# Patient Record
Sex: Male | Born: 1945
Health system: Southern US, Community
[De-identification: ages and names within clinical notes are randomized; demographics above are authoritative.]

## PROBLEM LIST (undated history)

## (undated) DIAGNOSIS — I10 Essential (primary) hypertension: Secondary | ICD-10-CM

## (undated) DIAGNOSIS — N183 Chronic kidney disease, stage 3 unspecified: Secondary | ICD-10-CM

## (undated) DIAGNOSIS — K219 Gastro-esophageal reflux disease without esophagitis: Secondary | ICD-10-CM

## (undated) DIAGNOSIS — D329 Benign neoplasm of meninges, unspecified: Secondary | ICD-10-CM

## (undated) DIAGNOSIS — Z974 Presence of external hearing-aid: Secondary | ICD-10-CM

## (undated) DIAGNOSIS — E785 Hyperlipidemia, unspecified: Secondary | ICD-10-CM

## (undated) DIAGNOSIS — E119 Type 2 diabetes mellitus without complications: Secondary | ICD-10-CM

## (undated) DIAGNOSIS — H9190 Unspecified hearing loss, unspecified ear: Secondary | ICD-10-CM

## (undated) DIAGNOSIS — Z9889 Other specified postprocedural states: Secondary | ICD-10-CM

## (undated) HISTORY — PX: BRAIN SURGERY: SHX531

## (undated) HISTORY — DX: Essential (primary) hypertension: I10

## (undated) HISTORY — DX: Type 2 diabetes mellitus without complications: E11.9

## (undated) HISTORY — DX: Hyperlipidemia, unspecified: E78.5

## (undated) HISTORY — PX: TONSILLECTOMY: SUR1361

## (undated) HISTORY — PX: APPENDECTOMY: SHX54

---

## 1997-05-22 ENCOUNTER — Emergency Department (HOSPITAL_COMMUNITY): Admission: EM | Admit: 1997-05-22 | Discharge: 1997-05-22 | Payer: Self-pay | Admitting: Emergency Medicine

## 1998-07-12 ENCOUNTER — Encounter: Payer: Self-pay | Admitting: Internal Medicine

## 1998-07-12 ENCOUNTER — Ambulatory Visit (HOSPITAL_COMMUNITY): Admission: RE | Admit: 1998-07-12 | Discharge: 1998-07-12 | Payer: Self-pay | Admitting: Internal Medicine

## 1998-07-15 ENCOUNTER — Encounter: Payer: Self-pay | Admitting: Neurological Surgery

## 1998-07-16 ENCOUNTER — Inpatient Hospital Stay (HOSPITAL_COMMUNITY): Admission: RE | Admit: 1998-07-16 | Discharge: 1998-07-19 | Payer: Self-pay | Admitting: Neurological Surgery

## 1999-06-10 ENCOUNTER — Encounter: Admission: RE | Admit: 1999-06-10 | Discharge: 1999-06-10 | Payer: Self-pay | Admitting: Neurological Surgery

## 1999-06-10 ENCOUNTER — Encounter: Payer: Self-pay | Admitting: Neurological Surgery

## 2000-06-08 ENCOUNTER — Encounter: Admission: RE | Admit: 2000-06-08 | Discharge: 2000-06-08 | Payer: Self-pay | Admitting: Neurological Surgery

## 2000-06-08 ENCOUNTER — Encounter: Payer: Self-pay | Admitting: Neurological Surgery

## 2003-05-17 ENCOUNTER — Encounter: Admission: RE | Admit: 2003-05-17 | Discharge: 2003-05-17 | Payer: Self-pay | Admitting: Neurological Surgery

## 2010-05-04 ENCOUNTER — Other Ambulatory Visit: Payer: Self-pay | Admitting: General Surgery

## 2010-05-04 ENCOUNTER — Inpatient Hospital Stay (HOSPITAL_COMMUNITY)
Admission: EM | Admit: 2010-05-04 | Discharge: 2010-05-07 | DRG: 340 | Disposition: A | Discharge status: Home / Self Care | Payer: Medicare Other | Attending: Surgery | Admitting: Surgery

## 2010-05-04 ENCOUNTER — Emergency Department (HOSPITAL_COMMUNITY): Payer: Medicare Other

## 2010-05-04 DIAGNOSIS — E785 Hyperlipidemia, unspecified: Secondary | ICD-10-CM | POA: Diagnosis present

## 2010-05-04 DIAGNOSIS — E7800 Pure hypercholesterolemia, unspecified: Secondary | ICD-10-CM | POA: Diagnosis present

## 2010-05-04 DIAGNOSIS — K35209 Acute appendicitis with generalized peritonitis, without abscess, unspecified as to perforation: Principal | ICD-10-CM | POA: Diagnosis present

## 2010-05-04 DIAGNOSIS — I10 Essential (primary) hypertension: Secondary | ICD-10-CM | POA: Diagnosis present

## 2010-05-04 DIAGNOSIS — K352 Acute appendicitis with generalized peritonitis, without abscess: Principal | ICD-10-CM | POA: Diagnosis present

## 2010-05-04 LAB — BASIC METABOLIC PANEL
BUN: 14 mg/dL (ref 6–23)
CO2: 26 mEq/L (ref 19–32)
Calcium: 8.6 mg/dL (ref 8.4–10.5)
Chloride: 106 mEq/L (ref 96–112)
Creatinine, Ser: 1.22 mg/dL (ref 0.4–1.5)
GFR calc Af Amer: >60 mL/min (ref >60)
GFR calc non Af Amer: 59 mL/min — ABNORMAL LOW (ref >60)
Glucose, Bld: 119 mg/dL — ABNORMAL HIGH (ref 70–99)
Potassium: 4.1 mEq/L (ref 3.5–5.1)
Sodium: 137 mEq/L (ref 135–145)

## 2010-05-04 MED ORDER — IOHEXOL 300 MG/ML  SOLN: 100.0000 mL | Freq: Once | INTRAMUSCULAR | Status: AC | PRN

## 2010-05-05 LAB — CBC
HCT: 39.9 % (ref 39.0–52.0)
MCHC: 34.6 g/dL (ref 30.0–36.0)
Platelets: 165 10*3/uL (ref 150–400)
RDW: 12.1 % (ref 11.5–15.5)
WBC: 12.4 10*3/uL — ABNORMAL HIGH (ref 4.0–10.5)

## 2010-05-06 LAB — CBC
Hemoglobin: 11.9 g/dL — ABNORMAL LOW (ref 13.0–17.0)
MCH: 32 pg (ref 26.0–34.0)
MCHC: 34.8 g/dL (ref 30.0–36.0)
Platelets: 134 10*3/uL — ABNORMAL LOW (ref 150–400)
RBC: 3.72 MIL/uL — ABNORMAL LOW (ref 4.22–5.81)

## 2010-05-06 LAB — COMPREHENSIVE METABOLIC PANEL
ALT: 14 U/L (ref 0–53)
AST: 21 U/L (ref 0–37)
Albumin: 2.6 g/dL — ABNORMAL LOW (ref 3.5–5.2)
Calcium: 7.8 mg/dL — ABNORMAL LOW (ref 8.4–10.5)
Chloride: 101 mEq/L (ref 96–112)
Creatinine, Ser: 1.17 mg/dL (ref 0.4–1.5)
GFR calc Af Amer: >60 mL/min (ref >60)
Sodium: 132 mEq/L — ABNORMAL LOW (ref 135–145)

## 2010-05-09 NOTE — Op Note (Signed)
Shaw, Luis             ACCOUNT NO.:  [id number]  MEDICAL RECORD NO.:  [id number]           PATIENT TYPE:  O  LOCATION:  5149                         FACILITY:  [facility]  PHYSICIAN:  Gabrielle Dare. Janee Morn, M.D.DATE OF BIRTH:  02/23/1945  DATE OF PROCEDURE:  05/04/2010 DATE OF DISCHARGE:                              OPERATIVE REPORT   PREOPERATIVE DIAGNOSIS:  Acute appendicitis  POSTOPERATIVE DIAGNOSIS:  Acute appendicitis with localized perforation.  PROCEDURE:  Laparoscopic appendectomy.  SURGEON:  Gabrielle Dare. Janee Morn, MD  ANESTHESIA:  General endotracheal.  HISTORY OF PRESENT ILLNESS:  Luis Shaw is a 65 year old gentleman who was evaluated in the emergency department for abdominal pain.  History, physical exam and CT scan of the abdomen and pelvis are consistent with acute appendicitis.  He is brought emergently to the operating room for appendectomy.  PROCEDURE IN DETAIL:  Informed consent was obtained.  The patient was identified in the preop holding area.  He received intravenous antibiotics.  He was brought to the operating room.  General endotracheal anesthesia was administered by the Anesthesia staff.  His abdomen was prepped and draped in a sterile fashion.  A Foley catheter had been placed by the nursing staff prior to prepping.  We did a time- out procedure.  The infraumbilical area was infiltrated with 0.25% Marcaine with epinephrine and infraumbilical incision was made. Subcutaneous tissues were dissected down revealing the anterior fascia. This was divided sharply along the midline and the peritoneal cavity was entered under direct vision without difficulty.  A 0 Vicryl pursestring suture was placed around the fascial opening.  Hasson trocar was inserted into the abdomen.  Next under direct vision, a 5-mm right midabdomen port and a 12-mm left lower quadrant port were placed.  A 0.25% Marcaine with epinephrine was used at these port sites as  well. Laparoscopic exploration revealed an acutely inflamed appendix with localized perforation along the mid portion.  There was only a small amount of localized lesion.  The appendix was retracted up  towards the anterior abdominal wall.  A portion of the mesoappendix was accessible. This was divided with the harmonic scalpel.  Next, the base of the appendix was dissected free.  It was not inflamed.  It was then divided with endoscopic GIA stapler with vascular load.  The staple line was intact.  The remainder of the mesoappendix was carefully divided with the harmonic scalpel and the appendix was placed in an EndoCatch bag and removed from the abdomen via the left lower quadrant port site.  The abdomen was then copiously irrigated with several liters of saline.  The right lower quadrant area was irrigated to a completely clean.  There was no bleeding from the mesoappendix.  The staple line remained completely intact.  The irrigation fluid was evacuated and it was clear. The pneumoperitoneum was released.  The ports were removed under direct vision.  The infraumbilical fascia was closed by tying a 0 Vicryl pursestring suture with care not to trap the intra-abdominal contents. All three wounds were copiously irrigated and the skin of each was then closed with running 4-0 Vicryl subcuticular stitch followed by Dermabond.  Sponge, needle and instrument counts were all correct.  The patient tolerated the procedure well without apparent complication, was taken to the recovery room in stable condition.     Gabrielle Dare Janee Morn, M.D.     BET/MEDQ  D:  05/04/2010  T:  05/04/2010  Job:  [id number]  cc:   Luis Shaw, M.D.  Electronically Signed by Violeta Gelinas M.D. on 05/09/2010 10:57:21 AM

## 2010-05-09 NOTE — H&P (Signed)
  Luis Shaw, Luis Shaw             ACCOUNT NO.:  [id number]  MEDICAL RECORD NO.:  [id number]           PATIENT TYPE:  O  LOCATION:  5149                         FACILITY:  [facility]  PHYSICIAN:  Gabrielle Dare. Janee Morn, M.D.DATE OF BIRTH:  1945/05/08  DATE OF ADMISSION:  05/04/2010 DATE OF DISCHARGE:                             HISTORY & PHYSICAL   CHIEF COMPLAINT:  Right lower quadrant abdominal pain.  HISTORY OF PRESENT ILLNESS:  Luis Shaw is a 65 year old white male who complains of acute onset of right lower quadrant abdominal pain at 12:30 this morning, awoken from sleep he continued to have pain with associated nausea.  He had no vomiting.  The patient went to an Urgent Care and was referred to the [facility] Emergency Department where further evaluation was done including CT scan of the abdomen and pelvis showing acute appendicitis.  PAST MEDICAL HISTORY:  Hypertension and hypercholesterolemia.  PAST SURGICAL HISTORY:  Brain tumor removed by Dr. Danielle Dess in 2000.  SOCIAL HISTORY:  Does not smoke, does not drink alcohol.  He is currently retired.  MEDICATIONS:  Lipitor and a blood pressure medication that he does not recall the name of.  REVIEW OF SYSTEMS:  GI: As above, otherwise negative.  PHYSICAL EXAM:  VITAL SIGNS:  Temperature 98.5, pulse 71, respirations 19, blood pressure 165/75. GENERAL:  He is awake and alert.  He is in no distress. HEENT:  He wears glasses.  Pupils are equal and reactive.  Oral mucosa is moist. NECK:  Supple with no tenderness or masses. LUNGS:  Clear to auscultation with good respiratory effort.  No wheezing is heard. HEART:  Regular, S1-S2 and pulses palpable in the left chest. ABDOMEN:  Tender in the right lower quadrant with no guarding present. Bowel sounds are hypoactive.  No masses are felt.  No organomegaly is noted. EXTREMITIES:  Have no deformity or tenderness. SKIN:  Warm and dry. NEUROLOGIC:  Grossly intact.  Speech is fluent.   Moves all extremities well.  LABORATORY STUDIES:  Sodium 137, potassium 4.1, chloride 106, CO2 26, BUN 14, creatinine 1.22, glucose 119.  CT scan of the abdomen and pelvis demonstrates acute appendicitis without evidence of perforation.  IMPRESSION:  Acute appendicitis.  PLAN:  We will take him to the operating room for laparoscopic appendectomy.  We will give him IV antibiotics.  Procedure risks and benefits were discussed in detail with the patient and his wife and he is agreeable.     Gabrielle Dare Janee Morn, M.D.     BET/MEDQ  D:  05/04/2010  T:  05/04/2010  Job:  [id number]  cc:   Hal T. Stoneking, M.D.  Electronically Signed by Violeta Gelinas M.D. on 05/09/2010 10:57:18 AM

## 2010-05-22 NOTE — Discharge Summary (Signed)
  Luis Shaw, Luis Shaw             ACCOUNT NO.:  [id number]  MEDICAL RECORD NO.:  [id number]           PATIENT TYPE:  I  LOCATION:  5149                         FACILITY:  [facility]  PHYSICIAN:  Thornton Park. Daphine Deutscher, MD  DATE OF BIRTH:  [birth year]  DATE OF ADMISSION:  05/04/2010 DATE OF DISCHARGE:  05/07/2010                        DISCHARGE SUMMARY - REFERRING   ADMISSION DIAGNOSES: 1. Right lower quadrant abdominal pain with acute appendicitis. 2. History of brain tumor removed in 2000 by Dr. Danielle Dess. 3. Hypertension. 4. Dyslipidemia.  DISCHARGE DIAGNOSES: 1. Right lower quadrant abdominal pain with acute appendicitis. 2. History of brain tumor removed in 2000 by Dr. Danielle Dess. 3. Hypertension. 4. Dyslipidemia.  PROCEDURES:  Laparoscopic appendectomy with findings of localized perforation.  BRIEF HISTORY:  The patient is a 65 year old white male who developed right lower quadrant abdominal pain, which woke him from sleep.  He presented to the ER at [facility].  He underwent a CT scan which was consistent with acute appendicitis without evidence of perforation.  He was taken to the operating room by Dr. Janee Morn and was found actually to have a localized perforation of his appendix.  The patient tolerated the procedure well and returned to the floor in satisfactory condition.  His white count improved, but he had a localized ileus.  This resolved early this morning, May 07, 2010.  He actually had eggs for breakfast.  He has no further nausea.  He is passing flatus without difficulty.  He would like a Dulcolax suppository to have a bowel movement.  Once that is done, we hope to discharge him home.  We will discharge him home on his preadmission medicines.  ADMISSION MEDICINES: 1. Lisinopril 10 mg daily. 2. Multivitamin 1 daily. 3. Simvastatin 20 mg daily.  ADDITIONAL DISCHARGE MEDICINES: 1. Augmentin 875/125 one p.o. b.i.d. for 5 days. 2. Ibuprofen 200 mg 2-3 tablets  q.4 h. p.r.n. for pain and     oxycodone/APAP 5/325 1-2 q.4 h. p.r.n. for pain.  FOLLOWUP:  In 2 weeks in the [facility].  LABORATORY DATA:  Electrolytes are normal.  BUN is 10, creatinine is 1.17.  White count is 11,000 yesterday with hemoglobin 11.9, hematocrit 34, platelets 134,000.  CONDITION ON DISCHARGE:  Improved.     Eber Hong, P.A.   ______________________________ Thornton Park Daphine Deutscher, MD    WDJ/MEDQ  D:  05/07/2010  T:  05/07/2010  Job:  [id number]  cc:   Hal T. Stoneking, M.D.  Electronically Signed by Sherrie George P.A. on 05/12/2010 11:52:50 AM Electronically Signed by Luretha Murphy MD on 05/22/2010 08:37:05 AM

## 2010-06-23 ENCOUNTER — Encounter (INDEPENDENT_AMBULATORY_CARE_PROVIDER_SITE_OTHER): Payer: Self-pay | Admitting: General Surgery

## 2011-03-25 ENCOUNTER — Other Ambulatory Visit: Payer: Self-pay | Admitting: Internal Medicine

## 2011-03-25 ENCOUNTER — Ambulatory Visit
Admission: RE | Admit: 2011-03-25 | Discharge: 2011-03-25 | Disposition: A | Discharge status: Home / Self Care | Payer: Medicare Other | Source: Ambulatory Visit | Attending: Internal Medicine | Admitting: Internal Medicine

## 2011-03-25 DIAGNOSIS — R1031 Right lower quadrant pain: Secondary | ICD-10-CM

## 2011-03-25 MED ORDER — IOHEXOL 300 MG/ML  SOLN
100.0000 mL | Freq: Once | INTRAMUSCULAR | Status: AC | PRN
  Administered 2011-03-25: 100 mL via INTRAVENOUS

## 2012-04-21 IMAGING — CT CT ABD-PELV W/ CM
1 of 5 series · 14 of 36 positions shown, 19 images · IV contrast (30CC OMNI 300 & [ID] OMNI 300)
Comparison: CT of the abdomen pelvis of 05/04/2010

CLINICAL DATA: Right lower quadrant pain for 4 days, history of
prior appendectomy

CT ABDOMEN AND PELVIS WITH CONTRAST
TECHNIQUE: Multidetector CT imaging of the abdomen and pelvis was
performed following the standard protocol during bolus
administration of intravenous contrast.
Contrast: 100mL OMNIPAQUE IOHEXOL 300 MG/ML IJ SOLN

[Series 2: abd/pelvis with · axial · 0.70mm/px · z∈[-411,+4]mm · 14 of 93 slices shown, 19 images]
[im 5/93  soft-tissue]
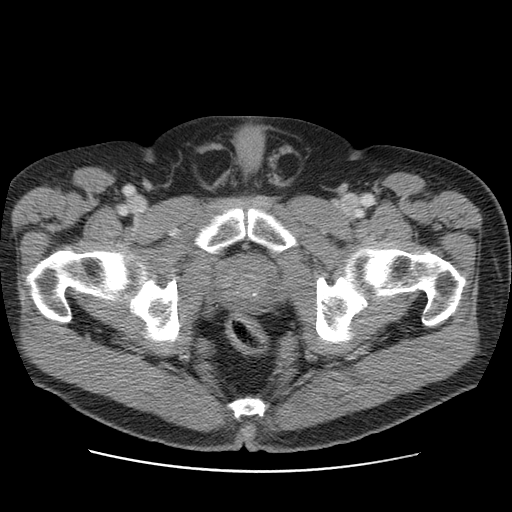
[im 5/93  bone]
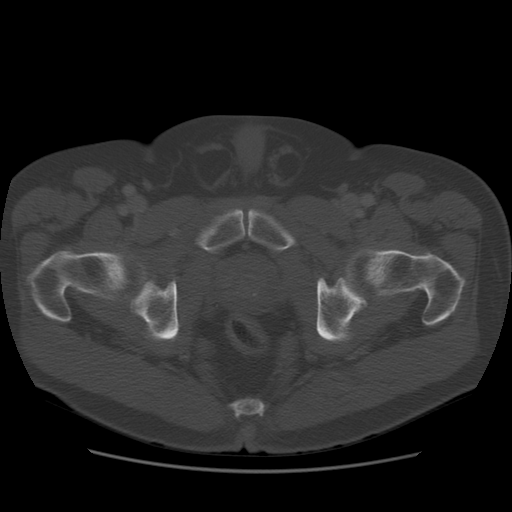
[im 15/93  soft-tissue]
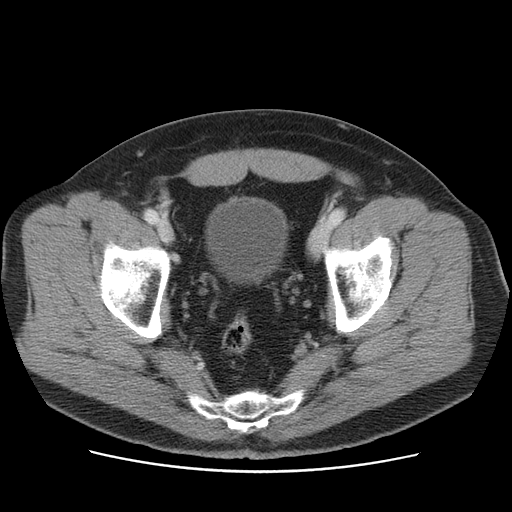
[im 20/93  soft-tissue]
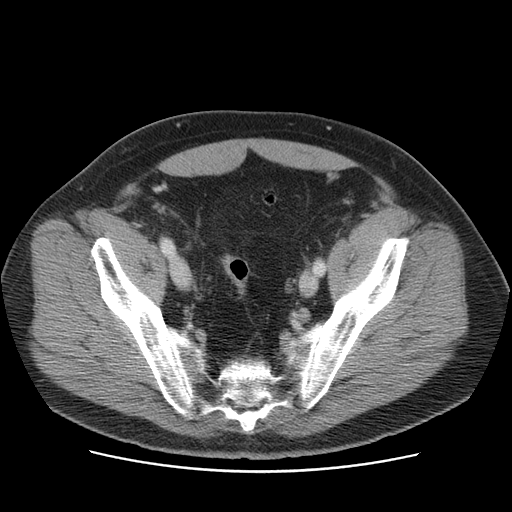
[im 25/93  soft-tissue]
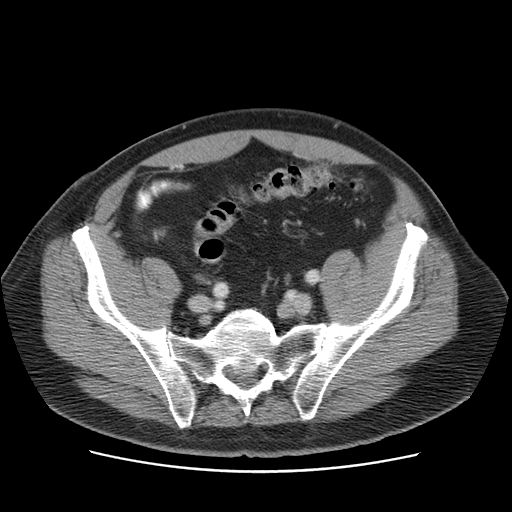
[im 34/93  soft-tissue]
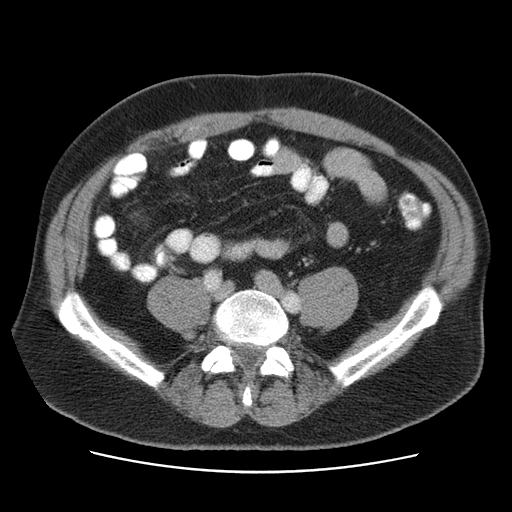
[im 39/93  soft-tissue]
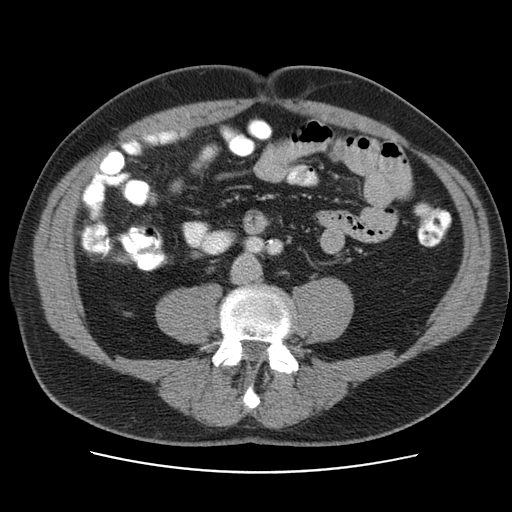
[im 49/93  soft-tissue]
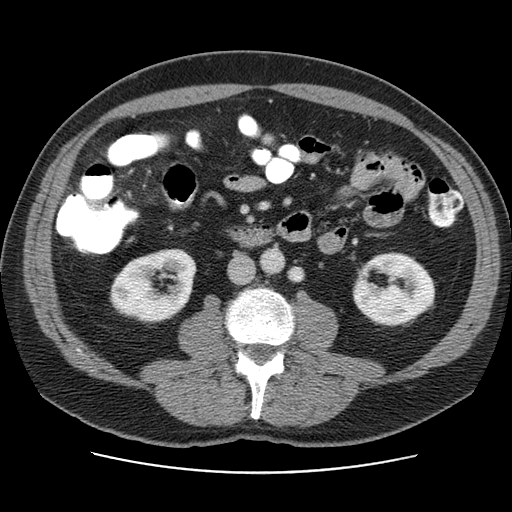
[im 54/93  soft-tissue]
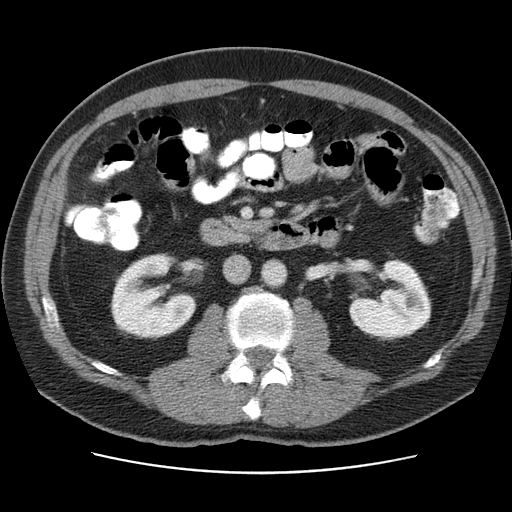
[im 59/93  soft-tissue]
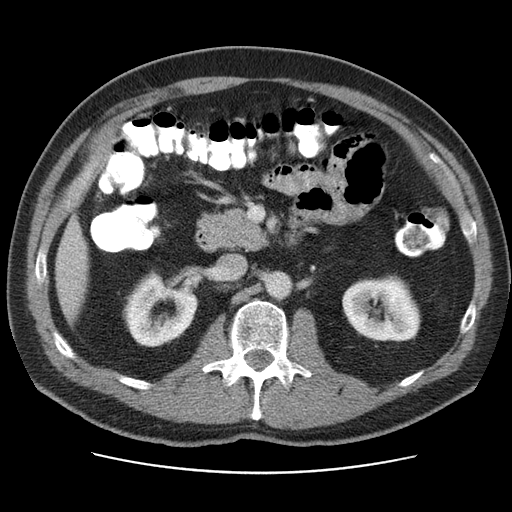
[im 59/93  bone]
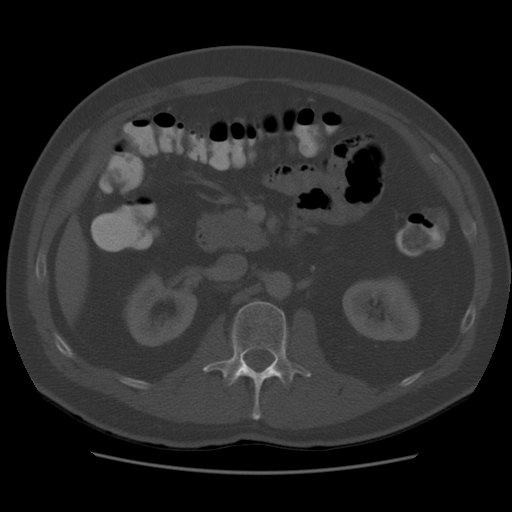
[im 68/93  soft-tissue]
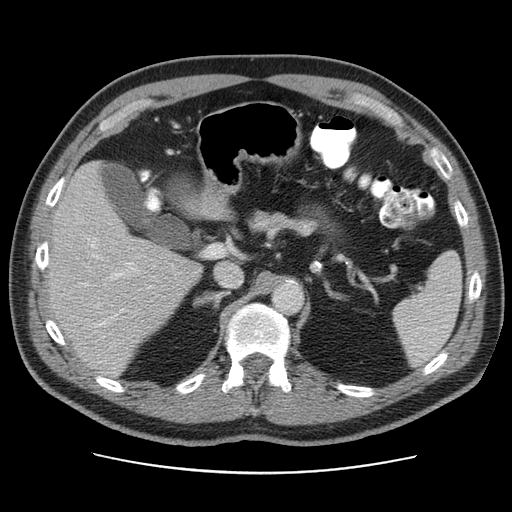
[im 73/93  soft-tissue]
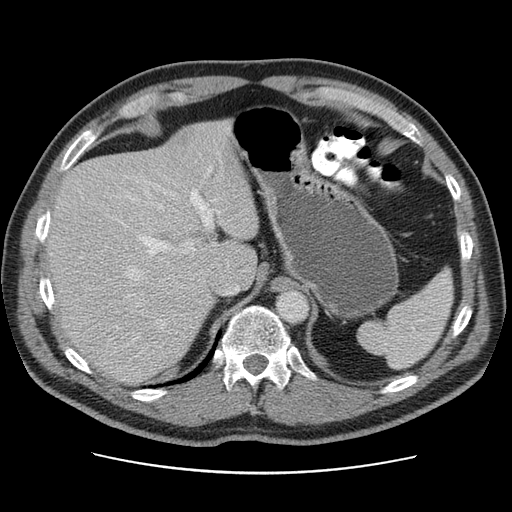
[im 73/93  lung]
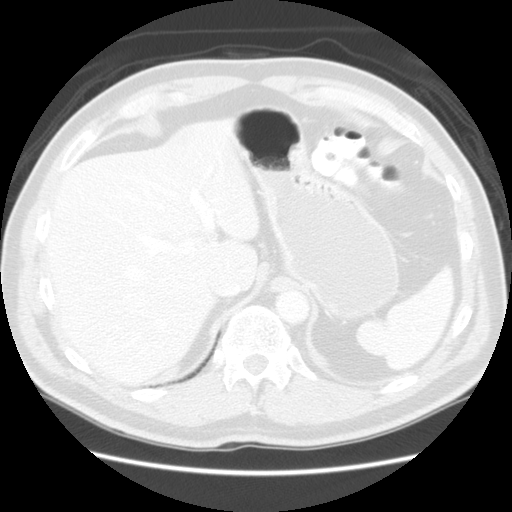
[im 78/93  soft-tissue]
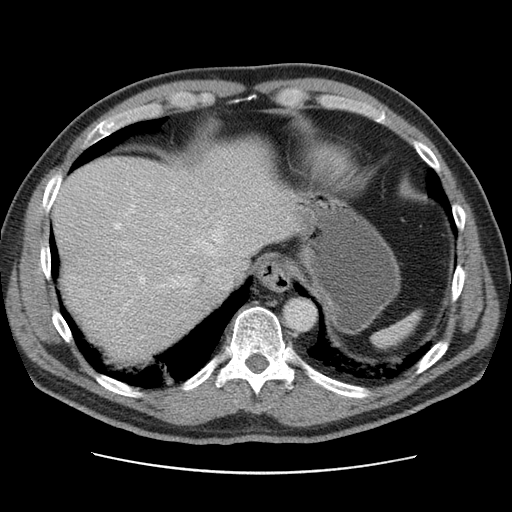
[im 78/93  lung]
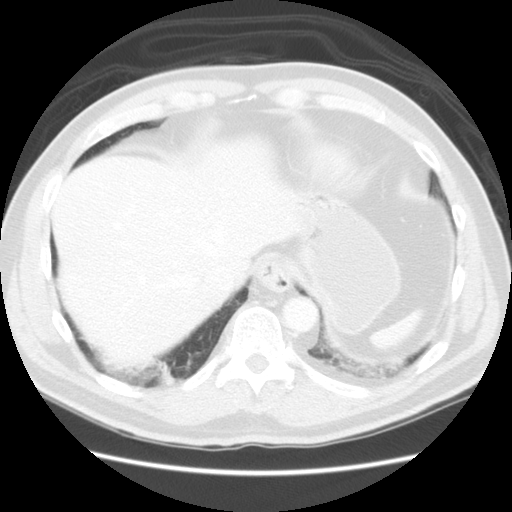
[im 83/93  lung]
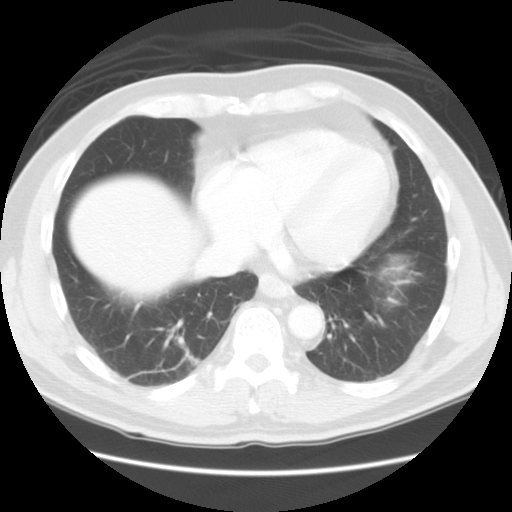
[im 88/93  soft-tissue]
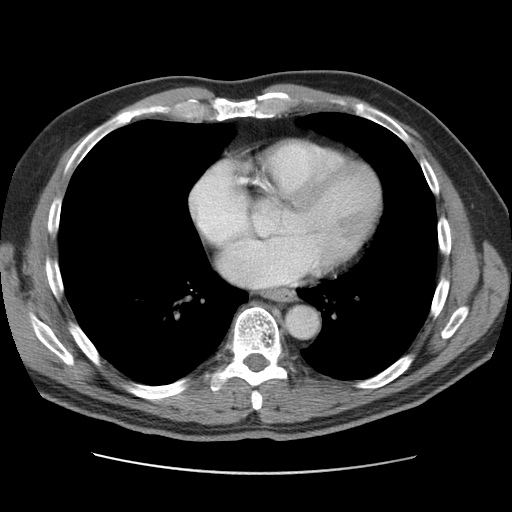
[im 88/93  lung]
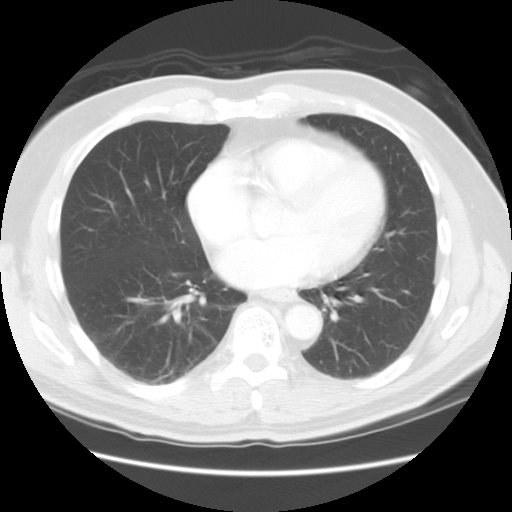

[14 of 36 positions shown; findings below may reference images not displayed]

FINDINGS: Linear scarring at the lung bases is stable.  The liver
enhances with no focal abnormality and no ductal dilatation is
seen.  Coronary artery calcifications are noted.  No calcified
gallstones are seen.  The pancreas is normal in size and the
pancreatic duct is not dilated.  The adrenal glands and spleen are
unremarkable.  The stomach is moderately fluid distended and there
may be a small hiatal hernia present.  No gastric abnormality is
evident.  The kidneys enhance and small renal cysts appear stable.
The abdominal aorta is normal in caliber with only mild
atheromatous change present.  No adenopathy is seen.

The urinary bladder is not well distended but is unremarkable.  The
distal ureters are normal in caliber.  The prostate is within
normal limits in size for age.  There are scattered rectosigmoid
colonic diverticula present but no diverticulitis is seen.  No
abnormality of the colon is seen.  The terminal ileum is
unremarkable.  The appendix has been resected.  Fat enters both
inguinal canals.  A sclerotic focus in the right pelvic ramus is
unremarkable.  No acute bony abnormality is seen.
IMPRESSION: 1.  No explanation for the patient's pain is seen.  Prior
appendectomy with no complicating features.
2.  The terminal ileum appears normal.
3.  Coronary artery calcifications.
4.  Scattered rectosigmoid colonic diverticula.

## 2013-11-14 ENCOUNTER — Encounter: Payer: Medicare HMO | Attending: Geriatric Medicine

## 2013-11-14 VITALS — Ht 69.0 in | Wt 187.1 lb

## 2013-11-14 DIAGNOSIS — Z713 Dietary counseling and surveillance: Secondary | ICD-10-CM | POA: Diagnosis not present

## 2013-11-14 DIAGNOSIS — E119 Type 2 diabetes mellitus without complications: Secondary | ICD-10-CM | POA: Insufficient documentation

## 2013-11-14 NOTE — Progress Notes (Signed)

## 2013-11-21 ENCOUNTER — Encounter: Payer: Commercial Managed Care - HMO | Attending: Geriatric Medicine

## 2013-11-21 DIAGNOSIS — E119 Type 2 diabetes mellitus without complications: Secondary | ICD-10-CM | POA: Insufficient documentation

## 2013-11-21 DIAGNOSIS — Z713 Dietary counseling and surveillance: Secondary | ICD-10-CM | POA: Diagnosis not present

## 2013-11-21 NOTE — Progress Notes (Signed)

## 2013-11-28 DIAGNOSIS — E119 Type 2 diabetes mellitus without complications: Secondary | ICD-10-CM

## 2013-11-30 NOTE — Progress Notes (Signed)
Patient was seen on 11/29/13 for the third of a series of three diabetes self-management courses at the [facility]. The following learning objectives were met by the patient during this class:  . State the amount of activity recommended for healthy living . Describe activities suitable for individual needs . Identify ways to regularly incorporate activity into daily life . Identify barriers to activity and ways to over come these barriers  Identify diabetes medications being personally used and their primary action for lowering glucose and possible side effects . Describe role of stress on blood glucose and develop strategies to address psychosocial issues . Identify diabetes complications and ways to prevent them  Explain how to manage diabetes during illness . Evaluate success in meeting personal goal . Establish 2-3 goals that they will plan to diligently work on until they return for the  64-monthfollow-up visit  Goals:   I will count my carb choices at most meals and snacks  I will be active 30 minutes or more 5 times a week  I will take my diabetes medications as scheduled  I will test my glucose at least 1 times a day, 7 days a week  Your patient has identified these potential barriers to change:  None noted  Your patient has identified their diabetes self-care support plan as  Family Support  Plan:  Attend Core 4 in 4 months

## 2013-12-05 ENCOUNTER — Ambulatory Visit: Payer: Medicare Other

## 2013-12-12 ENCOUNTER — Ambulatory Visit: Payer: Medicare Other

## 2013-12-19 ENCOUNTER — Ambulatory Visit: Payer: Medicare Other

## 2015-02-19 DIAGNOSIS — Z23 Encounter for immunization: Secondary | ICD-10-CM | POA: Diagnosis not present

## 2015-02-19 DIAGNOSIS — Z7984 Long term (current) use of oral hypoglycemic drugs: Secondary | ICD-10-CM | POA: Diagnosis not present

## 2015-02-19 DIAGNOSIS — I10 Essential (primary) hypertension: Secondary | ICD-10-CM | POA: Diagnosis not present

## 2015-02-19 DIAGNOSIS — G4733 Obstructive sleep apnea (adult) (pediatric): Secondary | ICD-10-CM | POA: Diagnosis not present

## 2015-02-19 DIAGNOSIS — E119 Type 2 diabetes mellitus without complications: Secondary | ICD-10-CM | POA: Diagnosis not present

## 2015-02-19 DIAGNOSIS — D369 Benign neoplasm, unspecified site: Secondary | ICD-10-CM | POA: Diagnosis not present

## 2015-02-19 DIAGNOSIS — M1A0790 Idiopathic chronic gout, unspecified ankle and foot, without tophus (tophi): Secondary | ICD-10-CM | POA: Diagnosis not present

## 2015-02-19 DIAGNOSIS — Z1389 Encounter for screening for other disorder: Secondary | ICD-10-CM | POA: Diagnosis not present

## 2015-02-19 DIAGNOSIS — Z79899 Other long term (current) drug therapy: Secondary | ICD-10-CM | POA: Diagnosis not present

## 2015-02-19 DIAGNOSIS — Z0000 Encounter for general adult medical examination without abnormal findings: Secondary | ICD-10-CM | POA: Diagnosis not present

## 2015-03-21 ENCOUNTER — Other Ambulatory Visit: Payer: Self-pay | Admitting: Gastroenterology

## 2015-04-30 ENCOUNTER — Ambulatory Visit (HOSPITAL_COMMUNITY): Admission: RE | Admit: 2015-04-30 | Payer: PPO | Source: Ambulatory Visit | Admitting: Gastroenterology

## 2015-04-30 ENCOUNTER — Encounter (HOSPITAL_COMMUNITY): Admission: RE | Payer: Self-pay | Source: Ambulatory Visit

## 2015-04-30 SURGERY — COLONOSCOPY WITH PROPOFOL
Anesthesia: Monitor Anesthesia Care

## 2015-08-15 DIAGNOSIS — Z7984 Long term (current) use of oral hypoglycemic drugs: Secondary | ICD-10-CM | POA: Diagnosis not present

## 2015-08-15 DIAGNOSIS — I10 Essential (primary) hypertension: Secondary | ICD-10-CM | POA: Diagnosis not present

## 2015-08-15 DIAGNOSIS — E119 Type 2 diabetes mellitus without complications: Secondary | ICD-10-CM | POA: Diagnosis not present

## 2015-12-16 DIAGNOSIS — G4733 Obstructive sleep apnea (adult) (pediatric): Secondary | ICD-10-CM | POA: Diagnosis not present

## 2016-02-25 DIAGNOSIS — Z7984 Long term (current) use of oral hypoglycemic drugs: Secondary | ICD-10-CM | POA: Diagnosis not present

## 2016-02-25 DIAGNOSIS — E119 Type 2 diabetes mellitus without complications: Secondary | ICD-10-CM | POA: Diagnosis not present

## 2016-02-25 DIAGNOSIS — E1165 Type 2 diabetes mellitus with hyperglycemia: Secondary | ICD-10-CM | POA: Diagnosis not present

## 2016-02-25 DIAGNOSIS — K635 Polyp of colon: Secondary | ICD-10-CM | POA: Diagnosis not present

## 2016-02-25 DIAGNOSIS — R809 Proteinuria, unspecified: Secondary | ICD-10-CM | POA: Diagnosis not present

## 2016-02-25 DIAGNOSIS — I10 Essential (primary) hypertension: Secondary | ICD-10-CM | POA: Diagnosis not present

## 2016-02-25 DIAGNOSIS — Z79899 Other long term (current) drug therapy: Secondary | ICD-10-CM | POA: Diagnosis not present

## 2016-02-25 DIAGNOSIS — Z1389 Encounter for screening for other disorder: Secondary | ICD-10-CM | POA: Diagnosis not present

## 2016-02-25 DIAGNOSIS — G4733 Obstructive sleep apnea (adult) (pediatric): Secondary | ICD-10-CM | POA: Diagnosis not present

## 2016-02-25 DIAGNOSIS — Z0000 Encounter for general adult medical examination without abnormal findings: Secondary | ICD-10-CM | POA: Diagnosis not present

## 2016-02-25 DIAGNOSIS — E1121 Type 2 diabetes mellitus with diabetic nephropathy: Secondary | ICD-10-CM | POA: Diagnosis not present

## 2016-02-25 DIAGNOSIS — E7800 Pure hypercholesterolemia, unspecified: Secondary | ICD-10-CM | POA: Diagnosis not present

## 2016-03-19 DIAGNOSIS — H35033 Hypertensive retinopathy, bilateral: Secondary | ICD-10-CM | POA: Diagnosis not present

## 2016-03-19 DIAGNOSIS — H524 Presbyopia: Secondary | ICD-10-CM | POA: Diagnosis not present

## 2016-03-19 DIAGNOSIS — E119 Type 2 diabetes mellitus without complications: Secondary | ICD-10-CM | POA: Diagnosis not present

## 2016-03-27 ENCOUNTER — Other Ambulatory Visit: Payer: Self-pay | Admitting: Gastroenterology

## 2016-04-21 DIAGNOSIS — E1165 Type 2 diabetes mellitus with hyperglycemia: Secondary | ICD-10-CM | POA: Diagnosis not present

## 2016-04-21 DIAGNOSIS — Z7984 Long term (current) use of oral hypoglycemic drugs: Secondary | ICD-10-CM | POA: Diagnosis not present

## 2016-04-21 DIAGNOSIS — Z79899 Other long term (current) drug therapy: Secondary | ICD-10-CM | POA: Diagnosis not present

## 2016-05-25 NOTE — Anesthesia Preprocedure Evaluation (Addendum)
Anesthesia Evaluation  Patient identified by MRN, date of birth, ID band Patient awake    Reviewed: Allergy & Precautions, Patient's Chart, lab work & pertinent test results  History of Anesthesia Complications Negative for: history of anesthetic complications  Airway Mallampati: II  TM Distance: >3 FB Neck ROM: Full    Dental no notable dental hx. (+) Dental Advisory Given   Pulmonary neg pulmonary ROS,    Pulmonary exam normal        Cardiovascular hypertension, Normal cardiovascular exam     Neuro/Psych negative neurological ROS  negative psych ROS   GI/Hepatic negative GI ROS, Neg liver ROS,   Endo/Other  diabetes  Renal/GU negative Renal ROS     Musculoskeletal negative musculoskeletal ROS (+)   Abdominal   Peds  Hematology negative hematology ROS (+)   Anesthesia Other Findings Day of surgery medications reviewed with the patient.  Reproductive/Obstetrics                            Anesthesia Physical Anesthesia Plan  ASA: II  Anesthesia Plan: MAC   Post-op Pain Management:    Induction:   Airway Management Planned: Natural Airway and Simple Face Mask  Additional Equipment:   Intra-op Plan:   Post-operative Plan:   Informed Consent: I have reviewed the patients History and Physical, chart, labs and discussed the procedure including the risks, benefits and alternatives for the proposed anesthesia with the patient or authorized representative who has indicated his/her understanding and acceptance.   Dental advisory given  Plan Discussed with: Anesthesiologist and CRNA  Anesthesia Plan Comments:        Anesthesia Quick Evaluation

## 2016-05-26 ENCOUNTER — Ambulatory Visit (HOSPITAL_COMMUNITY): Payer: PPO | Admitting: Anesthesiology

## 2016-05-26 ENCOUNTER — Encounter (HOSPITAL_COMMUNITY): Payer: Self-pay | Admitting: *Deleted

## 2016-05-26 ENCOUNTER — Ambulatory Visit (HOSPITAL_COMMUNITY)
Admission: RE | Admit: 2016-05-26 | Discharge: 2016-05-26 | Disposition: A | Discharge status: Home / Self Care | Payer: PPO | Source: Ambulatory Visit | Attending: Gastroenterology | Admitting: Gastroenterology

## 2016-05-26 ENCOUNTER — Encounter (HOSPITAL_COMMUNITY)
Admission: RE | Disposition: A | Discharge status: Home / Self Care | Payer: Self-pay | Source: Ambulatory Visit | Attending: Gastroenterology

## 2016-05-26 DIAGNOSIS — E119 Type 2 diabetes mellitus without complications: Secondary | ICD-10-CM | POA: Insufficient documentation

## 2016-05-26 DIAGNOSIS — Z1211 Encounter for screening for malignant neoplasm of colon: Secondary | ICD-10-CM | POA: Diagnosis not present

## 2016-05-26 DIAGNOSIS — I10 Essential (primary) hypertension: Secondary | ICD-10-CM | POA: Diagnosis not present

## 2016-05-26 DIAGNOSIS — E7800 Pure hypercholesterolemia, unspecified: Secondary | ICD-10-CM | POA: Diagnosis not present

## 2016-05-26 DIAGNOSIS — G4733 Obstructive sleep apnea (adult) (pediatric): Secondary | ICD-10-CM | POA: Insufficient documentation

## 2016-05-26 DIAGNOSIS — Z86010 Personal history of colonic polyps: Secondary | ICD-10-CM | POA: Insufficient documentation

## 2016-05-26 HISTORY — PX: COLONOSCOPY WITH PROPOFOL: SHX5780

## 2016-05-26 LAB — GLUCOSE, CAPILLARY: GLUCOSE-CAPILLARY: 147 mg/dL — AB (ref 65–99)

## 2016-05-26 SURGERY — COLONOSCOPY WITH PROPOFOL
Anesthesia: Monitor Anesthesia Care

## 2016-05-26 MED ORDER — LIDOCAINE 2% (20 MG/ML) 5 ML SYRINGE
INTRAMUSCULAR | Status: DC | PRN
  Administered 2016-05-26: 80 mg via INTRAVENOUS

## 2016-05-26 MED ORDER — ONDANSETRON HCL 4 MG/2ML IJ SOLN
INTRAMUSCULAR | Status: AC
  Filled 2016-05-26: qty 2

## 2016-05-26 MED ORDER — ONDANSETRON HCL 4 MG/2ML IJ SOLN
INTRAMUSCULAR | Status: DC | PRN
  Administered 2016-05-26: 4 mg via INTRAVENOUS

## 2016-05-26 MED ORDER — PROPOFOL 10 MG/ML IV BOLUS
INTRAVENOUS | Status: AC
  Filled 2016-05-26: qty 40

## 2016-05-26 MED ORDER — SODIUM CHLORIDE 0.9 % IV SOLN: INTRAVENOUS | Status: DC

## 2016-05-26 MED ORDER — PROPOFOL 500 MG/50ML IV EMUL
INTRAVENOUS | Status: DC | PRN
  Administered 2016-05-26: 75 ug/kg/min via INTRAVENOUS

## 2016-05-26 MED ORDER — LIDOCAINE 2% (20 MG/ML) 5 ML SYRINGE
INTRAMUSCULAR | Status: AC
  Filled 2016-05-26: qty 5

## 2016-05-26 MED ORDER — PROPOFOL 10 MG/ML IV BOLUS
INTRAVENOUS | Status: DC | PRN
  Administered 2016-05-26: 20 mg via INTRAVENOUS

## 2016-05-26 MED ORDER — LACTATED RINGERS IV SOLN
INTRAVENOUS | Status: DC
  Administered 2016-05-26: 1000 mL via INTRAVENOUS

## 2016-05-26 SURGICAL SUPPLY — 21 items

## 2016-05-26 NOTE — Transfer of Care (Signed)
Immediate Anesthesia Transfer of Care Note  Patient: Luis Shaw  Procedure(s) Performed: Procedure(s): COLONOSCOPY WITH PROPOFOL (N/A)  Patient Location: PACU  Anesthesia Type:MAC  Level of Consciousness:  sedated, patient cooperative and responds to stimulation  Airway & Oxygen Therapy:Patient Spontanous Breathing and Patient connected to face mask oxgen  Post-op Assessment:  Report given to PACU RN and Post -op Vital signs reviewed and stable  Post vital signs:  Reviewed and stable  Last Vitals:  Vitals:   05/26/16 0827  BP: (!) 181/76  Resp: 20  Temp: 82.9 C    Complications: No apparent anesthesia complications

## 2016-05-26 NOTE — Op Note (Signed)
[facility] Patient Name: Luis Shaw Procedure Date: 05/26/2016 MRN: [id number] Attending MD: Garlan Fair , MD Date of Birth: 09-08-1945 CSN: [id number] Age: 71 Admit Type: Outpatient Procedure:                Colonoscopy Indications:              High risk colon cancer surveillance: Personal                            history of non-advanced adenoma Providers:                Garlan Fair, MD, Hilma Favors, RN, Alan Mulder, Technician, Cherylynn Ridges, Technician Referring MD:              Medicines:                Propofol per Anesthesia Complications:            No immediate complications. Estimated Blood Loss:     Estimated blood loss was minimal. Procedure:                Pre-Anesthesia Assessment:                           - Prior to the procedure, a History and Physical                            was performed, and patient medications and                            allergies were reviewed. The patient's tolerance of                            previous anesthesia was also reviewed. The risks                            and benefits of the procedure and the sedation                            options and risks were discussed with the patient.                            All questions were answered, and informed consent                            was obtained. Prior Anticoagulants: The patient has                            taken aspirin, last dose was 1 day prior to                            procedure. ASA Grade Assessment: II - A patient  with mild systemic disease. After reviewing the                            risks and benefits, the patient was deemed in                            satisfactory condition to undergo the procedure.                           After obtaining informed consent, the colonoscope                            was passed under direct vision. Throughout the           procedure, the patient's blood pressure, pulse, and                            oxygen saturations were monitored continuously. The                            EC-[id number] ([id number]) scope was introduced through                            the anus and advanced to the the cecum, identified                            by appendiceal orifice and ileocecal valve. The                            colonoscopy was performed without difficulty. The                            patient tolerated the procedure well. The quality                            of the bowel preparation was good. The appendiceal                            orifice and the rectum were photographed. Scope In: 8:53:32 AM Scope Out: 9:14:36 AM Scope Withdrawal Time: 0 hours 15 minutes 23 seconds  Total Procedure Duration: 0 hours 21 minutes 4 seconds  Findings:      The perianal and digital rectal examinations were normal.      The entire examined colon appeared normal except for the removal if a 4       mm sessile descending colon polyp with the cold snare. The polyp was not       retrieved for pathological evaluation Impression:               - The entire examined colon is normal except for                            the removal of a diminuitive descending colon polyp  which was not retrieved for pathological evaluation  Moderate Sedation:      N/A- Per Anesthesia Care Recommendation:           - Patient has a contact number available for                            emergencies. The signs and symptoms of potential                            delayed complications were discussed with the                            patient. Return to normal activities tomorrow.                            Written discharge instructions were provided to the                            patient.                           - Repeat colonoscopy is not recommended for                            surveillance.                            - Resume previous diet.                           - Continue present medications. Procedure Code(s):        --- Professional ---                           Z6109, Colorectal cancer screening; colonoscopy on                            individual at high risk Diagnosis Code(s):        --- Professional ---                           Z86.010, Personal history of colonic polyps CPT copyright 2016 American Medical Association. All rights reserved. The codes documented in this report are preliminary and upon coder review may  be revised to meet current compliance requirements. Earle Gell, MD Garlan Fair, MD 05/26/2016 9:23:52 AM This report has been signed electronically. Number of Addenda: 0

## 2016-05-26 NOTE — Addendum Note (Signed)
Addendum  created 05/26/16 1324 by Duane Boston, MD   Sign clinical note

## 2016-05-26 NOTE — Discharge Instructions (Signed)

## 2016-05-26 NOTE — H&P (Signed)
Procedure: Surveillance colonoscopy. 08/30/2007 colonoscopy was performed with removal of three small tubular adenomatous and hyperplastic colon polyps  History: The patient is a 71 year old male born in October 06, 1945. He is scheduled to undergo a surveillance colonoscopy today.  Past medical history: Appendectomy was performed in April 2012. Left frontal meningioma resected in June 2000. Hypertension. Hypercholesterolemia. Type 2 diabetes mellitus diagnosed in September 2015. Obstructive sleep apnea syndrome.  Medication allergies: Ciprofloxacin caused rash  Exam: The patient is alert and lying comfortably on the endoscopy stretcher. Abdomen is soft and nontender to palpation. Lungs are clear to auscultation. Cardiac exam reveals a regular rhythm.  Plan: Proceed with surveillance colonoscopy

## 2016-05-26 NOTE — Anesthesia Postprocedure Evaluation (Addendum)
Anesthesia Post Note  Patient: Luis Shaw  Procedure(s) Performed: Procedure(s) (LRB): COLONOSCOPY WITH PROPOFOL (N/A)  Patient location during evaluation: Endoscopy Anesthesia Type: MAC Level of consciousness: awake and alert Pain management: pain level controlled Vital Signs Assessment: post-procedure vital signs reviewed and stable Respiratory status: spontaneous breathing and respiratory function stable Cardiovascular status: stable Anesthetic complications: no       Last Vitals:  Vitals:   05/26/16 0930 05/26/16 0940  BP: (!) 143/80 132/90  Pulse: (!) 52 (!) 52  Resp: 18 19  Temp:      Last Pain:  Vitals:   05/26/16 0924  TempSrc: Oral                 Cherissa Hook DANIEL

## 2016-05-28 ENCOUNTER — Encounter (HOSPITAL_COMMUNITY): Payer: Self-pay | Admitting: Gastroenterology

## 2016-06-20 NOTE — Addendum Note (Signed)
Addendum  created 06/20/16 0934 by Duane Boston, MD   Sign clinical note

## 2016-06-23 DIAGNOSIS — I10 Essential (primary) hypertension: Secondary | ICD-10-CM | POA: Diagnosis not present

## 2016-06-23 DIAGNOSIS — R809 Proteinuria, unspecified: Secondary | ICD-10-CM | POA: Diagnosis not present

## 2016-06-23 DIAGNOSIS — E1121 Type 2 diabetes mellitus with diabetic nephropathy: Secondary | ICD-10-CM | POA: Diagnosis not present

## 2016-06-23 DIAGNOSIS — Z7984 Long term (current) use of oral hypoglycemic drugs: Secondary | ICD-10-CM | POA: Diagnosis not present

## 2016-10-27 DIAGNOSIS — I10 Essential (primary) hypertension: Secondary | ICD-10-CM | POA: Diagnosis not present

## 2016-10-27 DIAGNOSIS — Z23 Encounter for immunization: Secondary | ICD-10-CM | POA: Diagnosis not present

## 2016-10-27 DIAGNOSIS — Z79899 Other long term (current) drug therapy: Secondary | ICD-10-CM | POA: Diagnosis not present

## 2016-10-27 DIAGNOSIS — E1121 Type 2 diabetes mellitus with diabetic nephropathy: Secondary | ICD-10-CM | POA: Diagnosis not present

## 2016-10-27 DIAGNOSIS — E663 Overweight: Secondary | ICD-10-CM | POA: Diagnosis not present

## 2016-10-27 DIAGNOSIS — E7800 Pure hypercholesterolemia, unspecified: Secondary | ICD-10-CM | POA: Diagnosis not present

## 2016-10-27 DIAGNOSIS — G4733 Obstructive sleep apnea (adult) (pediatric): Secondary | ICD-10-CM | POA: Diagnosis not present

## 2016-11-24 DIAGNOSIS — I10 Essential (primary) hypertension: Secondary | ICD-10-CM | POA: Diagnosis not present

## 2016-11-24 DIAGNOSIS — E1121 Type 2 diabetes mellitus with diabetic nephropathy: Secondary | ICD-10-CM | POA: Diagnosis not present

## 2016-11-24 DIAGNOSIS — E663 Overweight: Secondary | ICD-10-CM | POA: Diagnosis not present

## 2017-02-04 DIAGNOSIS — J018 Other acute sinusitis: Secondary | ICD-10-CM | POA: Diagnosis not present

## 2017-03-02 DIAGNOSIS — E7800 Pure hypercholesterolemia, unspecified: Secondary | ICD-10-CM | POA: Diagnosis not present

## 2017-03-02 DIAGNOSIS — Z79899 Other long term (current) drug therapy: Secondary | ICD-10-CM | POA: Diagnosis not present

## 2017-03-02 DIAGNOSIS — Z7984 Long term (current) use of oral hypoglycemic drugs: Secondary | ICD-10-CM | POA: Diagnosis not present

## 2017-03-02 DIAGNOSIS — Z0000 Encounter for general adult medical examination without abnormal findings: Secondary | ICD-10-CM | POA: Diagnosis not present

## 2017-03-02 DIAGNOSIS — E1121 Type 2 diabetes mellitus with diabetic nephropathy: Secondary | ICD-10-CM | POA: Diagnosis not present

## 2017-03-02 DIAGNOSIS — Z1389 Encounter for screening for other disorder: Secondary | ICD-10-CM | POA: Diagnosis not present

## 2017-03-02 DIAGNOSIS — G4733 Obstructive sleep apnea (adult) (pediatric): Secondary | ICD-10-CM | POA: Diagnosis not present

## 2017-03-02 DIAGNOSIS — I10 Essential (primary) hypertension: Secondary | ICD-10-CM | POA: Diagnosis not present

## 2017-08-31 DIAGNOSIS — Z79899 Other long term (current) drug therapy: Secondary | ICD-10-CM | POA: Diagnosis not present

## 2017-08-31 DIAGNOSIS — E7800 Pure hypercholesterolemia, unspecified: Secondary | ICD-10-CM | POA: Diagnosis not present

## 2017-08-31 DIAGNOSIS — E1121 Type 2 diabetes mellitus with diabetic nephropathy: Secondary | ICD-10-CM | POA: Diagnosis not present

## 2017-08-31 DIAGNOSIS — I10 Essential (primary) hypertension: Secondary | ICD-10-CM | POA: Diagnosis not present

## 2017-08-31 DIAGNOSIS — R809 Proteinuria, unspecified: Secondary | ICD-10-CM | POA: Diagnosis not present

## 2017-09-04 DIAGNOSIS — Z7984 Long term (current) use of oral hypoglycemic drugs: Secondary | ICD-10-CM | POA: Diagnosis not present

## 2017-09-04 DIAGNOSIS — N183 Chronic kidney disease, stage 3 (moderate): Secondary | ICD-10-CM | POA: Diagnosis not present

## 2017-09-04 DIAGNOSIS — I10 Essential (primary) hypertension: Secondary | ICD-10-CM | POA: Diagnosis not present

## 2017-09-04 DIAGNOSIS — E1121 Type 2 diabetes mellitus with diabetic nephropathy: Secondary | ICD-10-CM | POA: Diagnosis not present

## 2017-09-04 DIAGNOSIS — E1122 Type 2 diabetes mellitus with diabetic chronic kidney disease: Secondary | ICD-10-CM | POA: Diagnosis not present

## 2017-09-23 DIAGNOSIS — H903 Sensorineural hearing loss, bilateral: Secondary | ICD-10-CM | POA: Diagnosis not present

## 2017-10-05 DIAGNOSIS — H903 Sensorineural hearing loss, bilateral: Secondary | ICD-10-CM | POA: Diagnosis not present

## 2017-10-08 DIAGNOSIS — E1121 Type 2 diabetes mellitus with diabetic nephropathy: Secondary | ICD-10-CM | POA: Diagnosis not present

## 2017-10-08 DIAGNOSIS — N183 Chronic kidney disease, stage 3 (moderate): Secondary | ICD-10-CM | POA: Diagnosis not present

## 2017-10-08 DIAGNOSIS — I10 Essential (primary) hypertension: Secondary | ICD-10-CM | POA: Diagnosis not present

## 2017-10-08 DIAGNOSIS — E1122 Type 2 diabetes mellitus with diabetic chronic kidney disease: Secondary | ICD-10-CM | POA: Diagnosis not present

## 2017-11-02 DIAGNOSIS — E1121 Type 2 diabetes mellitus with diabetic nephropathy: Secondary | ICD-10-CM | POA: Diagnosis not present

## 2017-11-02 DIAGNOSIS — N183 Chronic kidney disease, stage 3 (moderate): Secondary | ICD-10-CM | POA: Diagnosis not present

## 2017-11-02 DIAGNOSIS — E1122 Type 2 diabetes mellitus with diabetic chronic kidney disease: Secondary | ICD-10-CM | POA: Diagnosis not present

## 2017-11-02 DIAGNOSIS — I10 Essential (primary) hypertension: Secondary | ICD-10-CM | POA: Diagnosis not present

## 2017-11-09 DIAGNOSIS — Z23 Encounter for immunization: Secondary | ICD-10-CM | POA: Diagnosis not present

## 2017-12-04 DIAGNOSIS — N183 Chronic kidney disease, stage 3 (moderate): Secondary | ICD-10-CM | POA: Diagnosis not present

## 2017-12-04 DIAGNOSIS — E1121 Type 2 diabetes mellitus with diabetic nephropathy: Secondary | ICD-10-CM | POA: Diagnosis not present

## 2017-12-04 DIAGNOSIS — E1122 Type 2 diabetes mellitus with diabetic chronic kidney disease: Secondary | ICD-10-CM | POA: Diagnosis not present

## 2017-12-04 DIAGNOSIS — I10 Essential (primary) hypertension: Secondary | ICD-10-CM | POA: Diagnosis not present

## 2018-02-15 DIAGNOSIS — E1121 Type 2 diabetes mellitus with diabetic nephropathy: Secondary | ICD-10-CM | POA: Diagnosis not present

## 2018-02-15 DIAGNOSIS — I10 Essential (primary) hypertension: Secondary | ICD-10-CM | POA: Diagnosis not present

## 2018-02-15 DIAGNOSIS — E1122 Type 2 diabetes mellitus with diabetic chronic kidney disease: Secondary | ICD-10-CM | POA: Diagnosis not present

## 2018-02-15 DIAGNOSIS — N183 Chronic kidney disease, stage 3 (moderate): Secondary | ICD-10-CM | POA: Diagnosis not present

## 2018-03-01 DIAGNOSIS — E1121 Type 2 diabetes mellitus with diabetic nephropathy: Secondary | ICD-10-CM | POA: Diagnosis not present

## 2018-03-01 DIAGNOSIS — Z7984 Long term (current) use of oral hypoglycemic drugs: Secondary | ICD-10-CM | POA: Diagnosis not present

## 2018-03-01 DIAGNOSIS — N183 Chronic kidney disease, stage 3 (moderate): Secondary | ICD-10-CM | POA: Diagnosis not present

## 2018-03-01 DIAGNOSIS — E1122 Type 2 diabetes mellitus with diabetic chronic kidney disease: Secondary | ICD-10-CM | POA: Diagnosis not present

## 2018-03-01 DIAGNOSIS — I10 Essential (primary) hypertension: Secondary | ICD-10-CM | POA: Diagnosis not present

## 2018-03-15 DIAGNOSIS — E7800 Pure hypercholesterolemia, unspecified: Secondary | ICD-10-CM | POA: Diagnosis not present

## 2018-03-15 DIAGNOSIS — Z125 Encounter for screening for malignant neoplasm of prostate: Secondary | ICD-10-CM | POA: Diagnosis not present

## 2018-03-15 DIAGNOSIS — E1121 Type 2 diabetes mellitus with diabetic nephropathy: Secondary | ICD-10-CM | POA: Diagnosis not present

## 2018-03-15 DIAGNOSIS — Z79899 Other long term (current) drug therapy: Secondary | ICD-10-CM | POA: Diagnosis not present

## 2018-03-15 DIAGNOSIS — Z0000 Encounter for general adult medical examination without abnormal findings: Secondary | ICD-10-CM | POA: Diagnosis not present

## 2018-03-15 DIAGNOSIS — Z1389 Encounter for screening for other disorder: Secondary | ICD-10-CM | POA: Diagnosis not present

## 2018-03-15 DIAGNOSIS — E1122 Type 2 diabetes mellitus with diabetic chronic kidney disease: Secondary | ICD-10-CM | POA: Diagnosis not present

## 2018-03-15 DIAGNOSIS — G4733 Obstructive sleep apnea (adult) (pediatric): Secondary | ICD-10-CM | POA: Diagnosis not present

## 2018-03-15 DIAGNOSIS — I10 Essential (primary) hypertension: Secondary | ICD-10-CM | POA: Diagnosis not present

## 2018-03-15 DIAGNOSIS — R809 Proteinuria, unspecified: Secondary | ICD-10-CM | POA: Diagnosis not present

## 2018-03-22 DIAGNOSIS — E1121 Type 2 diabetes mellitus with diabetic nephropathy: Secondary | ICD-10-CM | POA: Diagnosis not present

## 2018-03-22 DIAGNOSIS — N183 Chronic kidney disease, stage 3 (moderate): Secondary | ICD-10-CM | POA: Diagnosis not present

## 2018-03-22 DIAGNOSIS — E1122 Type 2 diabetes mellitus with diabetic chronic kidney disease: Secondary | ICD-10-CM | POA: Diagnosis not present

## 2018-03-22 DIAGNOSIS — I10 Essential (primary) hypertension: Secondary | ICD-10-CM | POA: Diagnosis not present

## 2018-04-26 DIAGNOSIS — I10 Essential (primary) hypertension: Secondary | ICD-10-CM | POA: Diagnosis not present

## 2018-04-26 DIAGNOSIS — N183 Chronic kidney disease, stage 3 (moderate): Secondary | ICD-10-CM | POA: Diagnosis not present

## 2018-04-26 DIAGNOSIS — E1121 Type 2 diabetes mellitus with diabetic nephropathy: Secondary | ICD-10-CM | POA: Diagnosis not present

## 2018-04-26 DIAGNOSIS — E1122 Type 2 diabetes mellitus with diabetic chronic kidney disease: Secondary | ICD-10-CM | POA: Diagnosis not present

## 2018-06-15 DIAGNOSIS — E1122 Type 2 diabetes mellitus with diabetic chronic kidney disease: Secondary | ICD-10-CM | POA: Diagnosis not present

## 2018-06-15 DIAGNOSIS — E1121 Type 2 diabetes mellitus with diabetic nephropathy: Secondary | ICD-10-CM | POA: Diagnosis not present

## 2018-06-24 DIAGNOSIS — E1122 Type 2 diabetes mellitus with diabetic chronic kidney disease: Secondary | ICD-10-CM | POA: Diagnosis not present

## 2018-06-24 DIAGNOSIS — N183 Chronic kidney disease, stage 3 (moderate): Secondary | ICD-10-CM | POA: Diagnosis not present

## 2018-06-24 DIAGNOSIS — I10 Essential (primary) hypertension: Secondary | ICD-10-CM | POA: Diagnosis not present

## 2018-06-24 DIAGNOSIS — E1121 Type 2 diabetes mellitus with diabetic nephropathy: Secondary | ICD-10-CM | POA: Diagnosis not present

## 2018-08-10 DIAGNOSIS — N183 Chronic kidney disease, stage 3 (moderate): Secondary | ICD-10-CM | POA: Diagnosis not present

## 2018-08-10 DIAGNOSIS — E1122 Type 2 diabetes mellitus with diabetic chronic kidney disease: Secondary | ICD-10-CM | POA: Diagnosis not present

## 2018-08-10 DIAGNOSIS — I10 Essential (primary) hypertension: Secondary | ICD-10-CM | POA: Diagnosis not present

## 2018-08-10 DIAGNOSIS — Z7984 Long term (current) use of oral hypoglycemic drugs: Secondary | ICD-10-CM | POA: Diagnosis not present

## 2018-08-10 DIAGNOSIS — E1121 Type 2 diabetes mellitus with diabetic nephropathy: Secondary | ICD-10-CM | POA: Diagnosis not present

## 2018-09-14 DIAGNOSIS — E1121 Type 2 diabetes mellitus with diabetic nephropathy: Secondary | ICD-10-CM | POA: Diagnosis not present

## 2018-09-14 DIAGNOSIS — I129 Hypertensive chronic kidney disease with stage 1 through stage 4 chronic kidney disease, or unspecified chronic kidney disease: Secondary | ICD-10-CM | POA: Diagnosis not present

## 2018-09-14 DIAGNOSIS — N183 Chronic kidney disease, stage 3 (moderate): Secondary | ICD-10-CM | POA: Diagnosis not present

## 2018-09-19 DIAGNOSIS — I129 Hypertensive chronic kidney disease with stage 1 through stage 4 chronic kidney disease, or unspecified chronic kidney disease: Secondary | ICD-10-CM | POA: Diagnosis not present

## 2018-09-19 DIAGNOSIS — E1121 Type 2 diabetes mellitus with diabetic nephropathy: Secondary | ICD-10-CM | POA: Diagnosis not present

## 2018-09-19 DIAGNOSIS — E1122 Type 2 diabetes mellitus with diabetic chronic kidney disease: Secondary | ICD-10-CM | POA: Diagnosis not present

## 2018-09-19 DIAGNOSIS — N183 Chronic kidney disease, stage 3 (moderate): Secondary | ICD-10-CM | POA: Diagnosis not present

## 2018-10-05 DIAGNOSIS — Z79899 Other long term (current) drug therapy: Secondary | ICD-10-CM | POA: Diagnosis not present

## 2018-10-05 DIAGNOSIS — I129 Hypertensive chronic kidney disease with stage 1 through stage 4 chronic kidney disease, or unspecified chronic kidney disease: Secondary | ICD-10-CM | POA: Diagnosis not present

## 2018-10-05 DIAGNOSIS — Z23 Encounter for immunization: Secondary | ICD-10-CM | POA: Diagnosis not present

## 2018-10-05 DIAGNOSIS — R5383 Other fatigue: Secondary | ICD-10-CM | POA: Diagnosis not present

## 2018-10-05 DIAGNOSIS — N183 Chronic kidney disease, stage 3 (moderate): Secondary | ICD-10-CM | POA: Diagnosis not present

## 2018-10-05 DIAGNOSIS — E1122 Type 2 diabetes mellitus with diabetic chronic kidney disease: Secondary | ICD-10-CM | POA: Diagnosis not present

## 2018-10-05 DIAGNOSIS — E1121 Type 2 diabetes mellitus with diabetic nephropathy: Secondary | ICD-10-CM | POA: Diagnosis not present

## 2018-10-17 DIAGNOSIS — I129 Hypertensive chronic kidney disease with stage 1 through stage 4 chronic kidney disease, or unspecified chronic kidney disease: Secondary | ICD-10-CM | POA: Diagnosis not present

## 2018-10-17 DIAGNOSIS — N183 Chronic kidney disease, stage 3 (moderate): Secondary | ICD-10-CM | POA: Diagnosis not present

## 2018-10-17 DIAGNOSIS — E1122 Type 2 diabetes mellitus with diabetic chronic kidney disease: Secondary | ICD-10-CM | POA: Diagnosis not present

## 2018-10-17 DIAGNOSIS — E1121 Type 2 diabetes mellitus with diabetic nephropathy: Secondary | ICD-10-CM | POA: Diagnosis not present

## 2018-11-09 DIAGNOSIS — R7401 Elevation of levels of liver transaminase levels: Secondary | ICD-10-CM | POA: Diagnosis not present

## 2018-11-09 DIAGNOSIS — Z79899 Other long term (current) drug therapy: Secondary | ICD-10-CM | POA: Diagnosis not present

## 2018-11-09 DIAGNOSIS — E1169 Type 2 diabetes mellitus with other specified complication: Secondary | ICD-10-CM | POA: Diagnosis not present

## 2018-11-09 DIAGNOSIS — I10 Essential (primary) hypertension: Secondary | ICD-10-CM | POA: Diagnosis not present

## 2018-12-13 DIAGNOSIS — E1169 Type 2 diabetes mellitus with other specified complication: Secondary | ICD-10-CM | POA: Diagnosis not present

## 2018-12-13 DIAGNOSIS — I10 Essential (primary) hypertension: Secondary | ICD-10-CM | POA: Diagnosis not present

## 2018-12-13 DIAGNOSIS — E7800 Pure hypercholesterolemia, unspecified: Secondary | ICD-10-CM | POA: Diagnosis not present

## 2019-01-06 DIAGNOSIS — E7800 Pure hypercholesterolemia, unspecified: Secondary | ICD-10-CM | POA: Diagnosis not present

## 2019-01-06 DIAGNOSIS — E1169 Type 2 diabetes mellitus with other specified complication: Secondary | ICD-10-CM | POA: Diagnosis not present

## 2019-01-06 DIAGNOSIS — I10 Essential (primary) hypertension: Secondary | ICD-10-CM | POA: Diagnosis not present

## 2019-01-31 DIAGNOSIS — I10 Essential (primary) hypertension: Secondary | ICD-10-CM | POA: Diagnosis not present

## 2019-01-31 DIAGNOSIS — E1169 Type 2 diabetes mellitus with other specified complication: Secondary | ICD-10-CM | POA: Diagnosis not present

## 2019-01-31 DIAGNOSIS — H6981 Other specified disorders of Eustachian tube, right ear: Secondary | ICD-10-CM | POA: Diagnosis not present

## 2019-02-06 DIAGNOSIS — E1169 Type 2 diabetes mellitus with other specified complication: Secondary | ICD-10-CM | POA: Diagnosis not present

## 2019-02-06 DIAGNOSIS — E7800 Pure hypercholesterolemia, unspecified: Secondary | ICD-10-CM | POA: Diagnosis not present

## 2019-02-06 DIAGNOSIS — I10 Essential (primary) hypertension: Secondary | ICD-10-CM | POA: Diagnosis not present

## 2019-03-07 DIAGNOSIS — E7800 Pure hypercholesterolemia, unspecified: Secondary | ICD-10-CM | POA: Diagnosis not present

## 2019-03-07 DIAGNOSIS — E1169 Type 2 diabetes mellitus with other specified complication: Secondary | ICD-10-CM | POA: Diagnosis not present

## 2019-03-07 DIAGNOSIS — I10 Essential (primary) hypertension: Secondary | ICD-10-CM | POA: Diagnosis not present

## 2019-03-07 DIAGNOSIS — Z7984 Long term (current) use of oral hypoglycemic drugs: Secondary | ICD-10-CM | POA: Diagnosis not present

## 2019-03-29 DIAGNOSIS — I10 Essential (primary) hypertension: Secondary | ICD-10-CM | POA: Diagnosis not present

## 2019-03-29 DIAGNOSIS — E7800 Pure hypercholesterolemia, unspecified: Secondary | ICD-10-CM | POA: Diagnosis not present

## 2019-03-29 DIAGNOSIS — E1169 Type 2 diabetes mellitus with other specified complication: Secondary | ICD-10-CM | POA: Diagnosis not present

## 2019-03-29 DIAGNOSIS — G4733 Obstructive sleep apnea (adult) (pediatric): Secondary | ICD-10-CM | POA: Diagnosis not present

## 2019-03-29 DIAGNOSIS — Z79899 Other long term (current) drug therapy: Secondary | ICD-10-CM | POA: Diagnosis not present

## 2019-03-29 DIAGNOSIS — Z1389 Encounter for screening for other disorder: Secondary | ICD-10-CM | POA: Diagnosis not present

## 2019-03-29 DIAGNOSIS — Z0000 Encounter for general adult medical examination without abnormal findings: Secondary | ICD-10-CM | POA: Diagnosis not present

## 2019-04-11 DIAGNOSIS — Z7984 Long term (current) use of oral hypoglycemic drugs: Secondary | ICD-10-CM | POA: Diagnosis not present

## 2019-04-11 DIAGNOSIS — I10 Essential (primary) hypertension: Secondary | ICD-10-CM | POA: Diagnosis not present

## 2019-04-11 DIAGNOSIS — E1169 Type 2 diabetes mellitus with other specified complication: Secondary | ICD-10-CM | POA: Diagnosis not present

## 2019-04-11 DIAGNOSIS — E7800 Pure hypercholesterolemia, unspecified: Secondary | ICD-10-CM | POA: Diagnosis not present

## 2019-05-16 DIAGNOSIS — E7800 Pure hypercholesterolemia, unspecified: Secondary | ICD-10-CM | POA: Diagnosis not present

## 2019-05-16 DIAGNOSIS — I10 Essential (primary) hypertension: Secondary | ICD-10-CM | POA: Diagnosis not present

## 2019-05-16 DIAGNOSIS — E1169 Type 2 diabetes mellitus with other specified complication: Secondary | ICD-10-CM | POA: Diagnosis not present

## 2019-05-16 DIAGNOSIS — Z7984 Long term (current) use of oral hypoglycemic drugs: Secondary | ICD-10-CM | POA: Diagnosis not present

## 2019-06-05 DIAGNOSIS — E1169 Type 2 diabetes mellitus with other specified complication: Secondary | ICD-10-CM | POA: Diagnosis not present

## 2019-06-05 DIAGNOSIS — I10 Essential (primary) hypertension: Secondary | ICD-10-CM | POA: Diagnosis not present

## 2019-06-05 DIAGNOSIS — E7800 Pure hypercholesterolemia, unspecified: Secondary | ICD-10-CM | POA: Diagnosis not present

## 2019-07-10 DIAGNOSIS — I10 Essential (primary) hypertension: Secondary | ICD-10-CM | POA: Diagnosis not present

## 2019-07-10 DIAGNOSIS — E7800 Pure hypercholesterolemia, unspecified: Secondary | ICD-10-CM | POA: Diagnosis not present

## 2019-07-10 DIAGNOSIS — E1169 Type 2 diabetes mellitus with other specified complication: Secondary | ICD-10-CM | POA: Diagnosis not present

## 2019-08-10 DIAGNOSIS — I10 Essential (primary) hypertension: Secondary | ICD-10-CM | POA: Diagnosis not present

## 2019-08-10 DIAGNOSIS — E7800 Pure hypercholesterolemia, unspecified: Secondary | ICD-10-CM | POA: Diagnosis not present

## 2019-08-10 DIAGNOSIS — E1169 Type 2 diabetes mellitus with other specified complication: Secondary | ICD-10-CM | POA: Diagnosis not present

## 2019-09-04 DIAGNOSIS — I10 Essential (primary) hypertension: Secondary | ICD-10-CM | POA: Diagnosis not present

## 2019-09-04 DIAGNOSIS — E1169 Type 2 diabetes mellitus with other specified complication: Secondary | ICD-10-CM | POA: Diagnosis not present

## 2019-09-04 DIAGNOSIS — E7800 Pure hypercholesterolemia, unspecified: Secondary | ICD-10-CM | POA: Diagnosis not present

## 2019-10-02 DIAGNOSIS — Z7984 Long term (current) use of oral hypoglycemic drugs: Secondary | ICD-10-CM | POA: Diagnosis not present

## 2019-10-02 DIAGNOSIS — Z79899 Other long term (current) drug therapy: Secondary | ICD-10-CM | POA: Diagnosis not present

## 2019-10-02 DIAGNOSIS — Z23 Encounter for immunization: Secondary | ICD-10-CM | POA: Diagnosis not present

## 2019-10-02 DIAGNOSIS — E1169 Type 2 diabetes mellitus with other specified complication: Secondary | ICD-10-CM | POA: Diagnosis not present

## 2019-10-02 DIAGNOSIS — I10 Essential (primary) hypertension: Secondary | ICD-10-CM | POA: Diagnosis not present

## 2019-10-09 DIAGNOSIS — E7800 Pure hypercholesterolemia, unspecified: Secondary | ICD-10-CM | POA: Diagnosis not present

## 2019-10-09 DIAGNOSIS — I10 Essential (primary) hypertension: Secondary | ICD-10-CM | POA: Diagnosis not present

## 2019-10-09 DIAGNOSIS — E1169 Type 2 diabetes mellitus with other specified complication: Secondary | ICD-10-CM | POA: Diagnosis not present

## 2019-11-17 DIAGNOSIS — I10 Essential (primary) hypertension: Secondary | ICD-10-CM | POA: Diagnosis not present

## 2019-11-17 DIAGNOSIS — E7800 Pure hypercholesterolemia, unspecified: Secondary | ICD-10-CM | POA: Diagnosis not present

## 2019-11-17 DIAGNOSIS — E1169 Type 2 diabetes mellitus with other specified complication: Secondary | ICD-10-CM | POA: Diagnosis not present

## 2019-11-30 DIAGNOSIS — E7800 Pure hypercholesterolemia, unspecified: Secondary | ICD-10-CM | POA: Diagnosis not present

## 2019-11-30 DIAGNOSIS — I10 Essential (primary) hypertension: Secondary | ICD-10-CM | POA: Diagnosis not present

## 2019-11-30 DIAGNOSIS — E1169 Type 2 diabetes mellitus with other specified complication: Secondary | ICD-10-CM | POA: Diagnosis not present

## 2019-12-10 DIAGNOSIS — J209 Acute bronchitis, unspecified: Secondary | ICD-10-CM | POA: Diagnosis not present

## 2019-12-10 DIAGNOSIS — R051 Acute cough: Secondary | ICD-10-CM | POA: Diagnosis not present

## 2019-12-10 DIAGNOSIS — Z20828 Contact with and (suspected) exposure to other viral communicable diseases: Secondary | ICD-10-CM | POA: Diagnosis not present

## 2019-12-11 ENCOUNTER — Emergency Department (HOSPITAL_COMMUNITY)
Admission: EM | Admit: 2019-12-11 | Discharge: 2019-12-12 | Disposition: A | Discharge status: Home / Self Care | Payer: PPO | Attending: Emergency Medicine | Admitting: Emergency Medicine

## 2019-12-11 ENCOUNTER — Other Ambulatory Visit: Payer: Self-pay

## 2019-12-11 ENCOUNTER — Encounter (HOSPITAL_COMMUNITY): Payer: Self-pay | Admitting: Emergency Medicine

## 2019-12-11 ENCOUNTER — Emergency Department (HOSPITAL_COMMUNITY): Payer: PPO

## 2019-12-11 DIAGNOSIS — J189 Pneumonia, unspecified organism: Secondary | ICD-10-CM | POA: Diagnosis not present

## 2019-12-11 DIAGNOSIS — R509 Fever, unspecified: Secondary | ICD-10-CM | POA: Diagnosis not present

## 2019-12-11 DIAGNOSIS — Z7984 Long term (current) use of oral hypoglycemic drugs: Secondary | ICD-10-CM | POA: Insufficient documentation

## 2019-12-11 DIAGNOSIS — E119 Type 2 diabetes mellitus without complications: Secondary | ICD-10-CM | POA: Diagnosis not present

## 2019-12-11 DIAGNOSIS — R0789 Other chest pain: Secondary | ICD-10-CM | POA: Insufficient documentation

## 2019-12-11 DIAGNOSIS — R0981 Nasal congestion: Secondary | ICD-10-CM | POA: Insufficient documentation

## 2019-12-11 DIAGNOSIS — R918 Other nonspecific abnormal finding of lung field: Secondary | ICD-10-CM | POA: Diagnosis not present

## 2019-12-11 DIAGNOSIS — R059 Cough, unspecified: Secondary | ICD-10-CM | POA: Insufficient documentation

## 2019-12-11 DIAGNOSIS — I10 Essential (primary) hypertension: Secondary | ICD-10-CM | POA: Insufficient documentation

## 2019-12-11 DIAGNOSIS — Z7982 Long term (current) use of aspirin: Secondary | ICD-10-CM | POA: Insufficient documentation

## 2019-12-11 DIAGNOSIS — Z79899 Other long term (current) drug therapy: Secondary | ICD-10-CM | POA: Diagnosis not present

## 2019-12-11 DIAGNOSIS — R066 Hiccough: Secondary | ICD-10-CM | POA: Diagnosis not present

## 2019-12-11 LAB — CBC WITH DIFFERENTIAL/PLATELET
Abs Immature Granulocytes: 0.32 10*3/uL — ABNORMAL HIGH (ref 0.00–0.07)
Basophils Absolute: 0.1 10*3/uL (ref 0.0–0.1)
Basophils Relative: 1 %
Eosinophils Absolute: 0.1 10*3/uL (ref 0.0–0.5)
Eosinophils Relative: 1 %
HCT: 40.7 % (ref 39.0–52.0)
Hemoglobin: 13.9 g/dL (ref 13.0–17.0)
Immature Granulocytes: 3 %
Lymphocytes Relative: 12 %
Lymphs Abs: 1.4 10*3/uL (ref 0.7–4.0)
MCH: 32.9 pg (ref 26.0–34.0)
MCHC: 34.2 g/dL (ref 30.0–36.0)
MCV: 96.2 fL (ref 80.0–100.0)
Monocytes Absolute: 1.3 10*3/uL — ABNORMAL HIGH (ref 0.1–1.0)
Monocytes Relative: 11 %
Neutro Abs: 8.4 10*3/uL — ABNORMAL HIGH (ref 1.7–7.7)
Neutrophils Relative %: 72 %
Platelets: 246 10*3/uL (ref 150–400)
RBC: 4.23 MIL/uL (ref 4.22–5.81)
RDW: 13.3 % (ref 11.5–15.5)
WBC: 11.6 10*3/uL — ABNORMAL HIGH (ref 4.0–10.5)
nRBC: 0 % (ref 0.0–0.2)

## 2019-12-11 LAB — COMPREHENSIVE METABOLIC PANEL
ALT: 28 U/L (ref 0–44)
AST: 35 U/L (ref 15–41)
Albumin: 3.2 g/dL — ABNORMAL LOW (ref 3.5–5.0)
Alkaline Phosphatase: 67 U/L (ref 38–126)
Anion gap: 11 (ref 5–15)
BUN: 33 mg/dL — ABNORMAL HIGH (ref 8–23)
CO2: 17 mmol/L — ABNORMAL LOW (ref 22–32)
Calcium: 8.7 mg/dL — ABNORMAL LOW (ref 8.9–10.3)
Chloride: 106 mmol/L (ref 98–111)
Creatinine, Ser: 1.17 mg/dL (ref 0.61–1.24)
GFR, Estimated: >60 mL/min (ref >60)
Glucose, Bld: 172 mg/dL — ABNORMAL HIGH (ref 70–99)
Potassium: 5.7 mmol/L — ABNORMAL HIGH (ref 3.5–5.1)
Sodium: 134 mmol/L — ABNORMAL LOW (ref 135–145)
Total Bilirubin: 1.8 mg/dL — ABNORMAL HIGH (ref 0.3–1.2)
Total Protein: 6.5 g/dL (ref 6.5–8.1)

## 2019-12-11 MED ORDER — SODIUM CHLORIDE 0.9 % IV BOLUS
1000.0000 mL | Freq: Once | INTRAVENOUS | Status: AC
  Administered 2019-12-11: 1000 mL via INTRAVENOUS

## 2019-12-11 MED ORDER — PROCHLORPERAZINE EDISYLATE 10 MG/2ML IJ SOLN
10.0000 mg | Freq: Once | INTRAMUSCULAR | Status: AC
  Administered 2019-12-11: 10 mg via INTRAVENOUS
  Filled 2019-12-11: qty 2

## 2019-12-11 MED ORDER — IOHEXOL 300 MG/ML  SOLN
75.0000 mL | Freq: Once | INTRAMUSCULAR | Status: AC | PRN
  Administered 2019-12-12: 75 mL via INTRAVENOUS

## 2019-12-11 MED ORDER — SODIUM ZIRCONIUM CYCLOSILICATE 10 G PO PACK
10.0000 g | PACK | Freq: Once | ORAL | Status: AC
  Administered 2019-12-12: 10 g via ORAL
  Filled 2019-12-11: qty 1

## 2019-12-11 NOTE — ED Provider Notes (Signed)
Patient care signed out by Ronita Hipps, [facility], at end of shift.   Seen by PCP recently, Urgent care today for hiccups for the last 3 days. Low grade fever, blood tinged sputum on infrequent cough. Has had multiple negative COVID tests. CXR performed today concerning for infection vs PE vs mass.  In ED found to have mild hyperkalemia 5.7 (Lokelma given), CO2 17. Repeat CXR "stable RUQ infiltrate".   Patient pending CT chest for further evaluation. Given Compazine for hiccups.   12:30 - recheck. Hiccups have resolved with use of Compazine. Waiting on CT read to decide disposition.   1:10 - CT chest shows likely infection but obstructing process not excluded. Patient and wife informed about need for repeat imaging after treatment course. He is appropriate for discharge home after IV dose abx in ED. He will continue Doxycycline at home as previously prescribed. Written Rx Compazine provided in the event hiccups recur.   Charlann Lange, PA-C 12/12/19 0133    Maudie Flakes, MD 12/12/19 [phone]

## 2019-12-11 NOTE — ED Provider Notes (Signed)
[facility] DEPT Provider Note   CSN: [id number] Arrival date & time: 12/11/19  2044     History Chief Complaint  Patient presents with  . Hiccups    Luis Shaw is a 74 y.o. male.  HPI 74 year old male with a history of DMTII, hyperlipidemia, hypertension presents to the ER with complaints of hiccups x 3 days. Endorses nasal congestion, low grade fever, productive cough with blood tinged sputum occasionally. Seen by his PCP several times over the last few days, has had multiple negative COVID tests, chest x-ray was done and he was told to come to the ER.  There is concern for possible infection or PE.  He denies any unintended weight loss, abdominal pain, nausea, vomiting.  Has had poor sleep over the last 2 days due to the hiccups. No prior personal history of cancer but has multiple types of cancer in his family history.     Past Medical History:  Diagnosis Date  . Diabetes mellitus without complication ([place])   . Hyperlipidemia   . Hypertension     There are no problems to display for this patient.   Past Surgical History:  Procedure Laterality Date  . APPENDECTOMY    . BRAIN SURGERY    . COLONOSCOPY WITH PROPOFOL N/A 05/26/2016   Procedure: COLONOSCOPY WITH PROPOFOL;  Surgeon: Garlan Fair, MD;  Location: [facility] END[facility];  Service: Endoscopy;  Laterality: N/A;       History reviewed. No pertinent family history.  Social History   Tobacco Use  . Smoking status: Never Smoker  . Smokeless tobacco: Never Used  Substance Use Topics  . Alcohol use: Not on file  . Drug use: Not on file    Home Medications Prior to Admission medications   Medication Sig Start Date End Date Taking? Authorizing Provider  amLODipine (NORVASC) 5 MG tablet Take 5 mg by mouth at bedtime. 12/07/19  Yes [provider]  aspirin EC 81 MG tablet Take 81 mg by mouth daily at 12 noon.   Yes [provider]  benzonatate (TESSALON) 200 MG  capsule Take 200 mg by mouth every 8 (eight) hours as needed for cough.  12/10/19  Yes [provider]  doxycycline (VIBRAMYCIN) 100 MG capsule Take 100 mg by mouth 2 (two) times daily. 12/10/19  Yes [provider]  ibuprofen (ADVIL,MOTRIN) 200 MG tablet Take 400 mg by mouth daily as needed for mild pain or moderate pain.   Yes [provider]  JARDIANCE 25 MG TABS tablet Take 25 mg by mouth daily. 12/08/19  Yes [provider]  lisinopril (PRINIVIL,ZESTRIL) 10 MG tablet Take 20 mg by mouth daily at 12 noon.    Yes [provider]  metFORMIN (GLUCOPHAGE) 500 MG tablet Take 1,000 mg by mouth 2 (two) times daily. 12/07/19  Yes [provider]  Misc Natural Products (TART CHERRY ADVANCED) CAPS Take 1 tablet by mouth daily. 12/08/19  Yes [provider]  simvastatin (ZOCOR) 80 MG tablet Take 80 mg by mouth at bedtime. 12/07/19  Yes [provider]    Allergies    Ciprofloxacin  Review of Systems   Review of Systems  Constitutional: Negative for chills and fever.  HENT: Positive for congestion. Negative for ear pain and sore throat.   Eyes: Negative for pain and visual disturbance.  Respiratory: Positive for cough. Negative for shortness of breath.        Hiccups   Cardiovascular: Negative for chest pain  and palpitations.  Gastrointestinal: Negative for abdominal pain and vomiting.  Genitourinary: Negative for dysuria and hematuria.  Musculoskeletal: Negative for arthralgias and back pain.  Skin: Negative for color change and rash.  Neurological: Negative for seizures and syncope.  All other systems reviewed and are negative.   Physical Exam Updated Vital Signs BP (!) 147/86   Pulse 95   Temp 99.1 F (37.3 C) (Oral)   Resp (!) 24   Ht 5\' 9"  (1.753 m)   Wt 78.9 kg   SpO2 96%   BMI 25.70 kg/m   Physical Exam Vitals and nursing note reviewed.  Constitutional:      General: He is not in acute distress.     Appearance: He is well-developed. He is not ill-appearing, toxic-appearing or diaphoretic.  HENT:     Head: Normocephalic and atraumatic.     Mouth/Throat:     Mouth: Mucous membranes are moist.     Pharynx: Oropharynx is clear.  Eyes:     Conjunctiva/sclera: Conjunctivae normal.  Cardiovascular:     Rate and Rhythm: Normal rate and regular rhythm.     Pulses: Normal pulses.     Heart sounds: Normal heart sounds. No murmur heard.   Pulmonary:     Effort: Pulmonary effort is normal. No respiratory distress.     Breath sounds: Normal breath sounds.  Abdominal:     General: Abdomen is flat.     Palpations: Abdomen is soft.     Tenderness: There is no abdominal tenderness.  Musculoskeletal:        General: No signs of injury. Normal range of motion.     Cervical back: Normal range of motion and neck supple.     Right lower leg: No edema.     Left lower leg: No edema.  Skin:    General: Skin is warm and dry.     Capillary Refill: Capillary refill takes less than 2 seconds.     Findings: No rash.  Neurological:     General: No focal deficit present.     Mental Status: He is alert and oriented to person, place, and time.     ED Results / Procedures / Treatments   Labs (all labs ordered are listed, but only abnormal results are displayed) Labs Reviewed  COMPREHENSIVE METABOLIC PANEL - Abnormal; Notable for the following components:      Result Value   Sodium 134 (*)    Potassium 5.7 (*)    CO2 17 (*)    Glucose, Bld 172 (*)    BUN 33 (*)    Calcium 8.7 (*)    Albumin 3.2 (*)    Total Bilirubin 1.8 (*)    All other components within normal limits  CBC WITH DIFFERENTIAL/PLATELET - Abnormal; Notable for the following components:   WBC 11.6 (*)    Neutro Abs 8.4 (*)    Monocytes Absolute 1.3 (*)    Abs Immature Granulocytes 0.32 (*)    All other components within normal limits  CBC WITH DIFFERENTIAL/PLATELET  URINALYSIS, ROUTINE W REFLEX MICROSCOPIC    EKG EKG  Interpretation  Date/Time:  Monday December 11 2019 22:52:28 EST Ventricular Rate:  86 PR Interval:    QRS Duration: 95 QT Interval:  362 QTC Calculation: 433 R Axis:   76 Text Interpretation: Sinus rhythm Low voltage, precordial leads Confirmed by Gerlene Fee [phone]) on 12/11/2019 11:28:04 PM   Radiology DG Chest Portable 1 View  Result Date: 12/11/2019 CLINICAL DATA:  Hiccups. EXAM:  PORTABLE CHEST 1 VIEW COMPARISON:  December 11, 2019 (7:55 p.m.) FINDINGS: Persistent mild right upper lobe infiltrate is seen. A stable area of adjacent scarring and/or atelectasis is seen within the region of the minor fissure. The heart size and mediastinal contours are within normal limits. The visualized skeletal structures are unremarkable. IMPRESSION: Stable mild right upper lobe infiltrate with adjacent scarring and/or atelectasis. Electronically Signed   By: Virgina Norfolk M.D.   On: 12/11/2019 22:43    Procedures Procedures (including critical care time)  Medications Ordered in ED Medications  sodium zirconium cyclosilicate (LOKELMA) packet 10 g (has no administration in time range)  iohexol (OMNIPAQUE) 300 MG/ML solution 75 mL (has no administration in time range)  prochlorperazine (COMPAZINE) injection 10 mg (10 mg Intravenous Given 12/11/19 2310)  sodium chloride 0.9 % bolus 1,000 mL (1,000 mLs Intravenous New Bag/Given (Non-Interop) 12/11/19 2312)    ED Course  I have reviewed the triage vital signs and the nursing notes.  Pertinent labs & imaging results that were available during my care of the patient were reviewed by me and considered in my medical decision making (see chart for details).    MDM Rules/Calculators/A&P                          74 year old male with 3 days of hiccups Presentation, he is alert, oriented, nontoxic-appearing, hiccuping but in no acute respiratory distress Vitals on arrival with some hypertension with a blood pressure of 165/84, temp 99.1.  No  evidence of hypoxia, tachypnea or tachycardia.  Exam with clear lung sounds, soft nontender abdomen.  No lower extremity edema noted  DDx includes pneumonia, PE, lung/abdominal carcinoma, pancreatitis   Labs ordered, reviewed and interpreted by myself -CBC with daily mild leukocytosis of 11.6.  Otherwise no other significant abnormalities noted -CMP with hyponatremia 134, hyperkalemia 5.7, hypocalcemia of 8.7 and a CO2 of 17.  Slightly elevated total bilirubin as well at 1.8.  Imaging reviewed and interpreted by myself and radiology -Chest x-ray with stable right upper lobe infiltrate  MDM: Patient was also seen and evaluated by my supervising attending Dr.Bero.  There is still concern for possible malignancy.  Patient is not on any potassium sparing diuretics, though he is on lisinopril. Lokelma given for hyperkalemia. Compazine and fluids for hiccups.   Signed out patient to [employer] who will oversee CT scan, treat and dispo accordingly   Final Clinical Impression(s) / ED Diagnoses Final diagnoses:  None    Rx / DC Orders ED Discharge Orders    None       Lyndel Safe 12/11/19 2356    Maudie Flakes, MD 12/12/19 1850

## 2019-12-11 NOTE — ED Triage Notes (Signed)
Pt arriving with complaint of hiccups over the last 3 days. Pt reports having a chest xray earlier today at urgent care and they advised him to come to the ER for further evaluation.

## 2019-12-12 DIAGNOSIS — R059 Cough, unspecified: Secondary | ICD-10-CM | POA: Diagnosis not present

## 2019-12-12 LAB — URINALYSIS, ROUTINE W REFLEX MICROSCOPIC
Bacteria, UA: NONE SEEN
Bilirubin Urine: NEGATIVE
Glucose, UA: >=500 mg/dL — AB
Hgb urine dipstick: NEGATIVE
Ketones, ur: 20 mg/dL — AB
Leukocytes,Ua: NEGATIVE
Nitrite: NEGATIVE
Protein, ur: 30 mg/dL — AB
Specific Gravity, Urine: 1.028 (ref 1.005–1.030)
pH: 5 (ref 5.0–8.0)

## 2019-12-12 MED ORDER — SODIUM CHLORIDE 0.9 % IV SOLN
1.0000 g | Freq: Once | INTRAVENOUS | Status: AC
  Administered 2019-12-12: 1 g via INTRAVENOUS
  Filled 2019-12-12: qty 10

## 2019-12-12 MED ORDER — AZITHROMYCIN 250 MG PO TABS: 500.0000 mg | ORAL_TABLET | Freq: Once | ORAL | Status: DC

## 2019-12-12 MED ORDER — PROCHLORPERAZINE MALEATE 10 MG PO TABS: 10.0000 mg | ORAL_TABLET | Freq: Four times a day (QID) | ORAL | 0 refills | Status: DC | PRN

## 2019-12-12 NOTE — Discharge Instructions (Addendum)
Continue Doxycycline 100 mg twice daily at home for treatment of pneumonia. It is safe to use the Tessalon (benzonatate) for cough if this provides relief. You have been given a prescription for Compazine, which was the medication that treated your hiccups. If hiccups recur, fill the prescription and take as directed.   Follow up with Dr. Felipa Eth for recheck in one week, sooner if you are not improving. Return to the emergency department if you have significant shortness of breath, high fever, severe pain or new concern.    CLINICAL DATA:  Cough.  Abnormal chest radiograph.   EXAM:  CT CHEST WITH CONTRAST   TECHNIQUE:  Multidetector CT imaging of the chest was performed during  intravenous contrast administration.   CONTRAST:  1mL OMNIPAQUE IOHEXOL 300 MG/ML  SOLN   COMPARISON:  Chest radiograph 12/11/2019   FINDINGS:  Cardiovascular: Normal heart size. No pericardial effusions.  Coronary artery and aortic calcifications. No aortic aneurysm or  dissection. Great vessel origins are patent.   Mediastinum/Nodes: Small esophageal hiatal hernia. The esophagus is  mostly decompressed. Prominent lymph nodes demonstrated throughout  the mediastinum, mostly in the right paratracheal and pretracheal  region. Lymph nodes are not pathologically enlarged and are likely  reactive.   Lungs/Pleura: Consolidation and airspace disease involving the right  upper lung with some volume loss and air bronchograms. This  corresponds to the chest x-ray abnormality. Most likely this  represents pneumonia but an occult obstructing lesion could also be  present and follow-up after resolution of the acute process is  recommended. The left lung is clear. No pleural effusions. No  pneumothorax. Airways are patent.   Upper Abdomen: No acute changes are suggested in the visualized  upper abdomen.   Musculoskeletal: Degenerative changes in the thoracic spine. No  destructive bone lesions.   IMPRESSION:    1. Consolidation and airspace disease involving the right upper lung  with some volume loss and air bronchograms. Most likely this  represents pneumonia but an occult obstructing lesion could also be  present and follow-up after resolution of the acute process is  recommended.  2. Prominent lymph nodes throughout the mediastinum, likely  reactive.  3. Small esophageal hiatal hernia.  4. Aortic atherosclerosis.

## 2019-12-27 ENCOUNTER — Other Ambulatory Visit: Payer: Self-pay | Admitting: Geriatric Medicine

## 2019-12-27 DIAGNOSIS — E1169 Type 2 diabetes mellitus with other specified complication: Secondary | ICD-10-CM | POA: Diagnosis not present

## 2019-12-27 DIAGNOSIS — R9389 Abnormal findings on diagnostic imaging of other specified body structures: Secondary | ICD-10-CM | POA: Diagnosis not present

## 2019-12-27 DIAGNOSIS — I10 Essential (primary) hypertension: Secondary | ICD-10-CM | POA: Diagnosis not present

## 2019-12-27 DIAGNOSIS — Z7984 Long term (current) use of oral hypoglycemic drugs: Secondary | ICD-10-CM | POA: Diagnosis not present

## 2020-01-01 DIAGNOSIS — E7800 Pure hypercholesterolemia, unspecified: Secondary | ICD-10-CM | POA: Diagnosis not present

## 2020-01-01 DIAGNOSIS — E1169 Type 2 diabetes mellitus with other specified complication: Secondary | ICD-10-CM | POA: Diagnosis not present

## 2020-01-01 DIAGNOSIS — I10 Essential (primary) hypertension: Secondary | ICD-10-CM | POA: Diagnosis not present

## 2020-02-01 ENCOUNTER — Other Ambulatory Visit: Payer: Self-pay

## 2020-02-01 ENCOUNTER — Ambulatory Visit
Admission: RE | Admit: 2020-02-01 | Discharge: 2020-02-01 | Disposition: A | Discharge status: Home / Self Care | Payer: PPO | Source: Ambulatory Visit | Attending: Geriatric Medicine | Admitting: Geriatric Medicine

## 2020-02-01 DIAGNOSIS — J181 Lobar pneumonia, unspecified organism: Secondary | ICD-10-CM | POA: Diagnosis not present

## 2020-02-01 DIAGNOSIS — J984 Other disorders of lung: Secondary | ICD-10-CM | POA: Diagnosis not present

## 2020-02-01 DIAGNOSIS — I2510 Atherosclerotic heart disease of native coronary artery without angina pectoris: Secondary | ICD-10-CM | POA: Diagnosis not present

## 2020-02-01 DIAGNOSIS — R9389 Abnormal findings on diagnostic imaging of other specified body structures: Secondary | ICD-10-CM

## 2020-02-01 DIAGNOSIS — K449 Diaphragmatic hernia without obstruction or gangrene: Secondary | ICD-10-CM | POA: Diagnosis not present

## 2020-02-19 DIAGNOSIS — E7800 Pure hypercholesterolemia, unspecified: Secondary | ICD-10-CM | POA: Diagnosis not present

## 2020-02-19 DIAGNOSIS — I10 Essential (primary) hypertension: Secondary | ICD-10-CM | POA: Diagnosis not present

## 2020-02-19 DIAGNOSIS — E1169 Type 2 diabetes mellitus with other specified complication: Secondary | ICD-10-CM | POA: Diagnosis not present

## 2020-03-01 DIAGNOSIS — I10 Essential (primary) hypertension: Secondary | ICD-10-CM | POA: Diagnosis not present

## 2020-03-01 DIAGNOSIS — E1169 Type 2 diabetes mellitus with other specified complication: Secondary | ICD-10-CM | POA: Diagnosis not present

## 2020-03-01 DIAGNOSIS — E7800 Pure hypercholesterolemia, unspecified: Secondary | ICD-10-CM | POA: Diagnosis not present

## 2020-03-29 DIAGNOSIS — E1169 Type 2 diabetes mellitus with other specified complication: Secondary | ICD-10-CM | POA: Diagnosis not present

## 2020-03-29 DIAGNOSIS — I10 Essential (primary) hypertension: Secondary | ICD-10-CM | POA: Diagnosis not present

## 2020-03-29 DIAGNOSIS — E7800 Pure hypercholesterolemia, unspecified: Secondary | ICD-10-CM | POA: Diagnosis not present

## 2020-04-03 DIAGNOSIS — H2513 Age-related nuclear cataract, bilateral: Secondary | ICD-10-CM | POA: Diagnosis not present

## 2020-04-03 DIAGNOSIS — Z79899 Other long term (current) drug therapy: Secondary | ICD-10-CM | POA: Diagnosis not present

## 2020-04-03 DIAGNOSIS — Z0000 Encounter for general adult medical examination without abnormal findings: Secondary | ICD-10-CM | POA: Diagnosis not present

## 2020-04-03 DIAGNOSIS — I700 Atherosclerosis of aorta: Secondary | ICD-10-CM | POA: Diagnosis not present

## 2020-04-03 DIAGNOSIS — E1169 Type 2 diabetes mellitus with other specified complication: Secondary | ICD-10-CM | POA: Diagnosis not present

## 2020-04-03 DIAGNOSIS — E119 Type 2 diabetes mellitus without complications: Secondary | ICD-10-CM | POA: Diagnosis not present

## 2020-04-03 DIAGNOSIS — H52203 Unspecified astigmatism, bilateral: Secondary | ICD-10-CM | POA: Diagnosis not present

## 2020-04-03 DIAGNOSIS — H5213 Myopia, bilateral: Secondary | ICD-10-CM | POA: Diagnosis not present

## 2020-04-03 DIAGNOSIS — G4733 Obstructive sleep apnea (adult) (pediatric): Secondary | ICD-10-CM | POA: Diagnosis not present

## 2020-04-03 DIAGNOSIS — E7800 Pure hypercholesterolemia, unspecified: Secondary | ICD-10-CM | POA: Diagnosis not present

## 2020-04-03 DIAGNOSIS — I10 Essential (primary) hypertension: Secondary | ICD-10-CM | POA: Diagnosis not present

## 2020-04-03 DIAGNOSIS — H524 Presbyopia: Secondary | ICD-10-CM | POA: Diagnosis not present

## 2020-04-03 DIAGNOSIS — Z1389 Encounter for screening for other disorder: Secondary | ICD-10-CM | POA: Diagnosis not present

## 2020-05-29 DIAGNOSIS — E1169 Type 2 diabetes mellitus with other specified complication: Secondary | ICD-10-CM | POA: Diagnosis not present

## 2020-05-29 DIAGNOSIS — E7800 Pure hypercholesterolemia, unspecified: Secondary | ICD-10-CM | POA: Diagnosis not present

## 2020-05-29 DIAGNOSIS — I10 Essential (primary) hypertension: Secondary | ICD-10-CM | POA: Diagnosis not present

## 2020-06-24 DIAGNOSIS — I10 Essential (primary) hypertension: Secondary | ICD-10-CM | POA: Diagnosis not present

## 2020-06-24 DIAGNOSIS — E1169 Type 2 diabetes mellitus with other specified complication: Secondary | ICD-10-CM | POA: Diagnosis not present

## 2020-06-24 DIAGNOSIS — E7800 Pure hypercholesterolemia, unspecified: Secondary | ICD-10-CM | POA: Diagnosis not present

## 2020-08-05 DIAGNOSIS — I10 Essential (primary) hypertension: Secondary | ICD-10-CM | POA: Diagnosis not present

## 2020-08-05 DIAGNOSIS — E1169 Type 2 diabetes mellitus with other specified complication: Secondary | ICD-10-CM | POA: Diagnosis not present

## 2020-08-05 DIAGNOSIS — E7800 Pure hypercholesterolemia, unspecified: Secondary | ICD-10-CM | POA: Diagnosis not present

## 2020-08-26 DIAGNOSIS — E7800 Pure hypercholesterolemia, unspecified: Secondary | ICD-10-CM | POA: Diagnosis not present

## 2020-08-26 DIAGNOSIS — E1169 Type 2 diabetes mellitus with other specified complication: Secondary | ICD-10-CM | POA: Diagnosis not present

## 2020-08-26 DIAGNOSIS — I10 Essential (primary) hypertension: Secondary | ICD-10-CM | POA: Diagnosis not present

## 2020-11-22 DIAGNOSIS — E1169 Type 2 diabetes mellitus with other specified complication: Secondary | ICD-10-CM | POA: Diagnosis not present

## 2020-11-22 DIAGNOSIS — E7800 Pure hypercholesterolemia, unspecified: Secondary | ICD-10-CM | POA: Diagnosis not present

## 2020-11-22 DIAGNOSIS — I10 Essential (primary) hypertension: Secondary | ICD-10-CM | POA: Diagnosis not present

## 2021-02-25 DIAGNOSIS — I10 Essential (primary) hypertension: Secondary | ICD-10-CM | POA: Diagnosis not present

## 2021-02-25 DIAGNOSIS — E1169 Type 2 diabetes mellitus with other specified complication: Secondary | ICD-10-CM | POA: Diagnosis not present

## 2021-02-25 DIAGNOSIS — E7800 Pure hypercholesterolemia, unspecified: Secondary | ICD-10-CM | POA: Diagnosis not present

## 2021-04-16 DIAGNOSIS — G4733 Obstructive sleep apnea (adult) (pediatric): Secondary | ICD-10-CM | POA: Diagnosis not present

## 2021-04-16 DIAGNOSIS — I10 Essential (primary) hypertension: Secondary | ICD-10-CM | POA: Diagnosis not present

## 2021-04-16 DIAGNOSIS — I700 Atherosclerosis of aorta: Secondary | ICD-10-CM | POA: Diagnosis not present

## 2021-04-16 DIAGNOSIS — Z1389 Encounter for screening for other disorder: Secondary | ICD-10-CM | POA: Diagnosis not present

## 2021-04-16 DIAGNOSIS — E1169 Type 2 diabetes mellitus with other specified complication: Secondary | ICD-10-CM | POA: Diagnosis not present

## 2021-04-16 DIAGNOSIS — Z79899 Other long term (current) drug therapy: Secondary | ICD-10-CM | POA: Diagnosis not present

## 2021-04-16 DIAGNOSIS — Z0000 Encounter for general adult medical examination without abnormal findings: Secondary | ICD-10-CM | POA: Diagnosis not present

## 2021-04-16 DIAGNOSIS — E7800 Pure hypercholesterolemia, unspecified: Secondary | ICD-10-CM | POA: Diagnosis not present

## 2021-05-13 DIAGNOSIS — G4733 Obstructive sleep apnea (adult) (pediatric): Secondary | ICD-10-CM | POA: Diagnosis not present

## 2021-05-13 DIAGNOSIS — E1169 Type 2 diabetes mellitus with other specified complication: Secondary | ICD-10-CM | POA: Diagnosis not present

## 2021-05-13 DIAGNOSIS — I10 Essential (primary) hypertension: Secondary | ICD-10-CM | POA: Diagnosis not present

## 2021-05-16 DIAGNOSIS — I10 Essential (primary) hypertension: Secondary | ICD-10-CM | POA: Diagnosis not present

## 2021-05-16 DIAGNOSIS — E1169 Type 2 diabetes mellitus with other specified complication: Secondary | ICD-10-CM | POA: Diagnosis not present

## 2021-05-16 DIAGNOSIS — E7800 Pure hypercholesterolemia, unspecified: Secondary | ICD-10-CM | POA: Diagnosis not present

## 2021-05-21 DIAGNOSIS — E1169 Type 2 diabetes mellitus with other specified complication: Secondary | ICD-10-CM | POA: Diagnosis not present

## 2021-05-21 DIAGNOSIS — E7800 Pure hypercholesterolemia, unspecified: Secondary | ICD-10-CM | POA: Diagnosis not present

## 2021-05-21 DIAGNOSIS — I10 Essential (primary) hypertension: Secondary | ICD-10-CM | POA: Diagnosis not present

## 2021-05-26 DIAGNOSIS — G4733 Obstructive sleep apnea (adult) (pediatric): Secondary | ICD-10-CM | POA: Diagnosis not present

## 2021-06-26 DIAGNOSIS — G4733 Obstructive sleep apnea (adult) (pediatric): Secondary | ICD-10-CM | POA: Diagnosis not present

## 2021-07-26 DIAGNOSIS — G4733 Obstructive sleep apnea (adult) (pediatric): Secondary | ICD-10-CM | POA: Diagnosis not present

## 2021-07-31 DIAGNOSIS — E7800 Pure hypercholesterolemia, unspecified: Secondary | ICD-10-CM | POA: Diagnosis not present

## 2021-07-31 DIAGNOSIS — E1169 Type 2 diabetes mellitus with other specified complication: Secondary | ICD-10-CM | POA: Diagnosis not present

## 2021-07-31 DIAGNOSIS — I10 Essential (primary) hypertension: Secondary | ICD-10-CM | POA: Diagnosis not present

## 2021-08-14 DIAGNOSIS — I10 Essential (primary) hypertension: Secondary | ICD-10-CM | POA: Diagnosis not present

## 2021-08-14 DIAGNOSIS — G4733 Obstructive sleep apnea (adult) (pediatric): Secondary | ICD-10-CM | POA: Diagnosis not present

## 2021-08-26 DIAGNOSIS — G4733 Obstructive sleep apnea (adult) (pediatric): Secondary | ICD-10-CM | POA: Diagnosis not present

## 2021-09-26 DIAGNOSIS — G4733 Obstructive sleep apnea (adult) (pediatric): Secondary | ICD-10-CM | POA: Diagnosis not present

## 2021-10-15 DIAGNOSIS — Z23 Encounter for immunization: Secondary | ICD-10-CM | POA: Diagnosis not present

## 2021-10-15 DIAGNOSIS — E1169 Type 2 diabetes mellitus with other specified complication: Secondary | ICD-10-CM | POA: Diagnosis not present

## 2021-10-15 DIAGNOSIS — Z79899 Other long term (current) drug therapy: Secondary | ICD-10-CM | POA: Diagnosis not present

## 2021-10-15 DIAGNOSIS — I10 Essential (primary) hypertension: Secondary | ICD-10-CM | POA: Diagnosis not present

## 2021-10-26 DIAGNOSIS — G4733 Obstructive sleep apnea (adult) (pediatric): Secondary | ICD-10-CM | POA: Diagnosis not present

## 2021-11-12 DIAGNOSIS — E7800 Pure hypercholesterolemia, unspecified: Secondary | ICD-10-CM | POA: Diagnosis not present

## 2021-11-12 DIAGNOSIS — I10 Essential (primary) hypertension: Secondary | ICD-10-CM | POA: Diagnosis not present

## 2021-11-12 DIAGNOSIS — E1169 Type 2 diabetes mellitus with other specified complication: Secondary | ICD-10-CM | POA: Diagnosis not present

## 2021-11-26 DIAGNOSIS — G4733 Obstructive sleep apnea (adult) (pediatric): Secondary | ICD-10-CM | POA: Diagnosis not present

## 2021-12-26 DIAGNOSIS — G4733 Obstructive sleep apnea (adult) (pediatric): Secondary | ICD-10-CM | POA: Diagnosis not present

## 2022-01-26 DIAGNOSIS — G4733 Obstructive sleep apnea (adult) (pediatric): Secondary | ICD-10-CM | POA: Diagnosis not present

## 2022-02-05 DIAGNOSIS — E7800 Pure hypercholesterolemia, unspecified: Secondary | ICD-10-CM | POA: Diagnosis not present

## 2022-02-05 DIAGNOSIS — I10 Essential (primary) hypertension: Secondary | ICD-10-CM | POA: Diagnosis not present

## 2022-02-05 DIAGNOSIS — E1169 Type 2 diabetes mellitus with other specified complication: Secondary | ICD-10-CM | POA: Diagnosis not present

## 2022-02-26 DIAGNOSIS — G4733 Obstructive sleep apnea (adult) (pediatric): Secondary | ICD-10-CM | POA: Diagnosis not present

## 2022-03-27 DIAGNOSIS — G4733 Obstructive sleep apnea (adult) (pediatric): Secondary | ICD-10-CM | POA: Diagnosis not present

## 2022-04-27 DIAGNOSIS — G4733 Obstructive sleep apnea (adult) (pediatric): Secondary | ICD-10-CM | POA: Diagnosis not present

## 2022-05-27 DIAGNOSIS — G4733 Obstructive sleep apnea (adult) (pediatric): Secondary | ICD-10-CM | POA: Diagnosis not present

## 2022-07-15 DIAGNOSIS — Z0000 Encounter for general adult medical examination without abnormal findings: Secondary | ICD-10-CM | POA: Diagnosis not present

## 2022-07-15 DIAGNOSIS — E1165 Type 2 diabetes mellitus with hyperglycemia: Secondary | ICD-10-CM | POA: Diagnosis not present

## 2022-07-15 DIAGNOSIS — I700 Atherosclerosis of aorta: Secondary | ICD-10-CM | POA: Diagnosis not present

## 2022-07-15 DIAGNOSIS — G4733 Obstructive sleep apnea (adult) (pediatric): Secondary | ICD-10-CM | POA: Diagnosis not present

## 2022-07-15 DIAGNOSIS — Z79899 Other long term (current) drug therapy: Secondary | ICD-10-CM | POA: Diagnosis not present

## 2022-07-15 DIAGNOSIS — Z9989 Dependence on other enabling machines and devices: Secondary | ICD-10-CM | POA: Diagnosis not present

## 2022-07-15 DIAGNOSIS — Z23 Encounter for immunization: Secondary | ICD-10-CM | POA: Diagnosis not present

## 2022-07-15 DIAGNOSIS — Z8739 Personal history of other diseases of the musculoskeletal system and connective tissue: Secondary | ICD-10-CM | POA: Diagnosis not present

## 2022-07-15 DIAGNOSIS — I10 Essential (primary) hypertension: Secondary | ICD-10-CM | POA: Diagnosis not present

## 2022-07-15 DIAGNOSIS — E7800 Pure hypercholesterolemia, unspecified: Secondary | ICD-10-CM | POA: Diagnosis not present

## 2022-07-15 DIAGNOSIS — Z125 Encounter for screening for malignant neoplasm of prostate: Secondary | ICD-10-CM | POA: Diagnosis not present

## 2022-07-15 DIAGNOSIS — Z1211 Encounter for screening for malignant neoplasm of colon: Secondary | ICD-10-CM | POA: Diagnosis not present

## 2022-07-15 DIAGNOSIS — Z1331 Encounter for screening for depression: Secondary | ICD-10-CM | POA: Diagnosis not present

## 2022-08-18 DIAGNOSIS — G4733 Obstructive sleep apnea (adult) (pediatric): Secondary | ICD-10-CM | POA: Diagnosis not present

## 2022-08-18 DIAGNOSIS — I10 Essential (primary) hypertension: Secondary | ICD-10-CM | POA: Diagnosis not present

## 2023-01-05 DIAGNOSIS — I700 Atherosclerosis of aorta: Secondary | ICD-10-CM | POA: Diagnosis not present

## 2023-01-05 DIAGNOSIS — E7800 Pure hypercholesterolemia, unspecified: Secondary | ICD-10-CM | POA: Diagnosis not present

## 2023-01-05 DIAGNOSIS — Z1211 Encounter for screening for malignant neoplasm of colon: Secondary | ICD-10-CM | POA: Diagnosis not present

## 2023-01-05 DIAGNOSIS — E1165 Type 2 diabetes mellitus with hyperglycemia: Secondary | ICD-10-CM | POA: Diagnosis not present

## 2023-01-05 DIAGNOSIS — Z8739 Personal history of other diseases of the musculoskeletal system and connective tissue: Secondary | ICD-10-CM | POA: Diagnosis not present

## 2023-01-05 DIAGNOSIS — G4733 Obstructive sleep apnea (adult) (pediatric): Secondary | ICD-10-CM | POA: Diagnosis not present

## 2023-01-05 DIAGNOSIS — Z79899 Other long term (current) drug therapy: Secondary | ICD-10-CM | POA: Diagnosis not present

## 2023-01-05 DIAGNOSIS — I10 Essential (primary) hypertension: Secondary | ICD-10-CM | POA: Diagnosis not present

## 2023-01-05 DIAGNOSIS — K529 Noninfective gastroenteritis and colitis, unspecified: Secondary | ICD-10-CM | POA: Diagnosis not present

## 2023-01-05 DIAGNOSIS — Z125 Encounter for screening for malignant neoplasm of prostate: Secondary | ICD-10-CM | POA: Diagnosis not present

## 2023-01-05 DIAGNOSIS — R350 Frequency of micturition: Secondary | ICD-10-CM | POA: Diagnosis not present

## 2023-05-12 DIAGNOSIS — E1169 Type 2 diabetes mellitus with other specified complication: Secondary | ICD-10-CM | POA: Diagnosis not present

## 2023-08-05 DIAGNOSIS — M545 Low back pain, unspecified: Secondary | ICD-10-CM | POA: Diagnosis not present

## 2023-08-12 DIAGNOSIS — M549 Dorsalgia, unspecified: Secondary | ICD-10-CM | POA: Diagnosis not present

## 2023-08-18 DIAGNOSIS — M7918 Myalgia, other site: Secondary | ICD-10-CM | POA: Diagnosis not present

## 2023-08-23 DIAGNOSIS — M545 Low back pain, unspecified: Secondary | ICD-10-CM | POA: Diagnosis not present

## 2023-08-24 DIAGNOSIS — Z8739 Personal history of other diseases of the musculoskeletal system and connective tissue: Secondary | ICD-10-CM | POA: Diagnosis not present

## 2023-08-24 DIAGNOSIS — Z1331 Encounter for screening for depression: Secondary | ICD-10-CM | POA: Diagnosis not present

## 2023-08-24 DIAGNOSIS — R209 Unspecified disturbances of skin sensation: Secondary | ICD-10-CM | POA: Diagnosis not present

## 2023-08-24 DIAGNOSIS — G4733 Obstructive sleep apnea (adult) (pediatric): Secondary | ICD-10-CM | POA: Diagnosis not present

## 2023-08-24 DIAGNOSIS — Z1211 Encounter for screening for malignant neoplasm of colon: Secondary | ICD-10-CM | POA: Diagnosis not present

## 2023-08-24 DIAGNOSIS — M545 Low back pain, unspecified: Secondary | ICD-10-CM | POA: Diagnosis not present

## 2023-08-24 DIAGNOSIS — Z0000 Encounter for general adult medical examination without abnormal findings: Secondary | ICD-10-CM | POA: Diagnosis not present

## 2023-08-24 DIAGNOSIS — Z125 Encounter for screening for malignant neoplasm of prostate: Secondary | ICD-10-CM | POA: Diagnosis not present

## 2023-08-24 DIAGNOSIS — I700 Atherosclerosis of aorta: Secondary | ICD-10-CM | POA: Diagnosis not present

## 2023-08-24 DIAGNOSIS — E1165 Type 2 diabetes mellitus with hyperglycemia: Secondary | ICD-10-CM | POA: Diagnosis not present

## 2023-08-24 DIAGNOSIS — Z79899 Other long term (current) drug therapy: Secondary | ICD-10-CM | POA: Diagnosis not present

## 2023-08-24 DIAGNOSIS — I10 Essential (primary) hypertension: Secondary | ICD-10-CM | POA: Diagnosis not present

## 2023-08-24 DIAGNOSIS — E7800 Pure hypercholesterolemia, unspecified: Secondary | ICD-10-CM | POA: Diagnosis not present

## 2023-08-26 ENCOUNTER — Encounter (HOSPITAL_BASED_OUTPATIENT_CLINIC_OR_DEPARTMENT_OTHER): Payer: Self-pay

## 2023-08-26 ENCOUNTER — Other Ambulatory Visit: Payer: Self-pay

## 2023-08-26 ENCOUNTER — Inpatient Hospital Stay (HOSPITAL_BASED_OUTPATIENT_CLINIC_OR_DEPARTMENT_OTHER)
Admission: EM | Admit: 2023-08-26 | Discharge: 2023-08-29 | DRG: 693 | Disposition: A | Discharge status: Home / Self Care | Attending: Internal Medicine | Admitting: Internal Medicine

## 2023-08-26 ENCOUNTER — Emergency Department (HOSPITAL_BASED_OUTPATIENT_CLINIC_OR_DEPARTMENT_OTHER)

## 2023-08-26 ENCOUNTER — Observation Stay (HOSPITAL_COMMUNITY)

## 2023-08-26 DIAGNOSIS — Z7985 Long-term (current) use of injectable non-insulin antidiabetic drugs: Secondary | ICD-10-CM

## 2023-08-26 DIAGNOSIS — N2889 Other specified disorders of kidney and ureter: Secondary | ICD-10-CM | POA: Diagnosis not present

## 2023-08-26 DIAGNOSIS — N135 Crossing vessel and stricture of ureter without hydronephrosis: Secondary | ICD-10-CM | POA: Diagnosis present

## 2023-08-26 DIAGNOSIS — E875 Hyperkalemia: Secondary | ICD-10-CM | POA: Diagnosis present

## 2023-08-26 DIAGNOSIS — Z7984 Long term (current) use of oral hypoglycemic drugs: Secondary | ICD-10-CM

## 2023-08-26 DIAGNOSIS — I700 Atherosclerosis of aorta: Secondary | ICD-10-CM | POA: Diagnosis present

## 2023-08-26 DIAGNOSIS — E1165 Type 2 diabetes mellitus with hyperglycemia: Secondary | ICD-10-CM | POA: Diagnosis not present

## 2023-08-26 DIAGNOSIS — N179 Acute kidney failure, unspecified: Secondary | ICD-10-CM | POA: Diagnosis present

## 2023-08-26 DIAGNOSIS — E861 Hypovolemia: Secondary | ICD-10-CM | POA: Diagnosis present

## 2023-08-26 DIAGNOSIS — K5900 Constipation, unspecified: Secondary | ICD-10-CM | POA: Diagnosis present

## 2023-08-26 DIAGNOSIS — N1330 Unspecified hydronephrosis: Secondary | ICD-10-CM | POA: Diagnosis not present

## 2023-08-26 DIAGNOSIS — H9190 Unspecified hearing loss, unspecified ear: Secondary | ICD-10-CM | POA: Diagnosis present

## 2023-08-26 DIAGNOSIS — N132 Hydronephrosis with renal and ureteral calculous obstruction: Principal | ICD-10-CM | POA: Diagnosis present

## 2023-08-26 DIAGNOSIS — E7800 Pure hypercholesterolemia, unspecified: Secondary | ICD-10-CM | POA: Diagnosis not present

## 2023-08-26 DIAGNOSIS — Z881 Allergy status to other antibiotic agents status: Secondary | ICD-10-CM

## 2023-08-26 DIAGNOSIS — K429 Umbilical hernia without obstruction or gangrene: Secondary | ICD-10-CM | POA: Diagnosis not present

## 2023-08-26 DIAGNOSIS — E860 Dehydration: Secondary | ICD-10-CM | POA: Diagnosis present

## 2023-08-26 DIAGNOSIS — R944 Abnormal results of kidney function studies: Secondary | ICD-10-CM | POA: Diagnosis present

## 2023-08-26 DIAGNOSIS — R0600 Dyspnea, unspecified: Secondary | ICD-10-CM | POA: Diagnosis not present

## 2023-08-26 DIAGNOSIS — Z7982 Long term (current) use of aspirin: Secondary | ICD-10-CM

## 2023-08-26 DIAGNOSIS — N200 Calculus of kidney: Secondary | ICD-10-CM | POA: Diagnosis not present

## 2023-08-26 DIAGNOSIS — R1084 Generalized abdominal pain: Secondary | ICD-10-CM | POA: Diagnosis not present

## 2023-08-26 DIAGNOSIS — E1110 Type 2 diabetes mellitus with ketoacidosis without coma: Secondary | ICD-10-CM | POA: Diagnosis not present

## 2023-08-26 DIAGNOSIS — R000 Tachycardia, unspecified: Secondary | ICD-10-CM | POA: Diagnosis not present

## 2023-08-26 DIAGNOSIS — N130 Hydronephrosis with ureteropelvic junction obstruction: Secondary | ICD-10-CM | POA: Diagnosis not present

## 2023-08-26 DIAGNOSIS — M545 Low back pain, unspecified: Secondary | ICD-10-CM | POA: Diagnosis not present

## 2023-08-26 DIAGNOSIS — Z79899 Other long term (current) drug therapy: Secondary | ICD-10-CM

## 2023-08-26 DIAGNOSIS — R202 Paresthesia of skin: Secondary | ICD-10-CM | POA: Diagnosis present

## 2023-08-26 DIAGNOSIS — R739 Hyperglycemia, unspecified: Secondary | ICD-10-CM | POA: Diagnosis present

## 2023-08-26 DIAGNOSIS — E785 Hyperlipidemia, unspecified: Secondary | ICD-10-CM | POA: Diagnosis present

## 2023-08-26 DIAGNOSIS — I10 Essential (primary) hypertension: Secondary | ICD-10-CM | POA: Diagnosis present

## 2023-08-26 DIAGNOSIS — Z743 Need for continuous supervision: Secondary | ICD-10-CM | POA: Diagnosis not present

## 2023-08-26 DIAGNOSIS — R7989 Other specified abnormal findings of blood chemistry: Secondary | ICD-10-CM | POA: Diagnosis present

## 2023-08-26 DIAGNOSIS — D72829 Elevated white blood cell count, unspecified: Secondary | ICD-10-CM | POA: Diagnosis present

## 2023-08-26 DIAGNOSIS — R200 Anesthesia of skin: Secondary | ICD-10-CM | POA: Diagnosis present

## 2023-08-26 HISTORY — DX: Type 2 diabetes mellitus with ketoacidosis without coma: E11.10

## 2023-08-26 LAB — I-STAT VENOUS BLOOD GAS, ED
Acid-base deficit: 9 mmol/L — ABNORMAL HIGH (ref 0.0–2.0)
Bicarbonate: 15.5 mmol/L — ABNORMAL LOW (ref 20.0–28.0)
Calcium, Ion: 1.33 mmol/L (ref 1.15–1.40)
HCT: 50 % (ref 39.0–52.0)
Hemoglobin: 17 g/dL (ref 13.0–17.0)
O2 Saturation: 42 %
Patient temperature: 97.5
Potassium: 5 mmol/L (ref 3.5–5.1)
Sodium: 135 mmol/L (ref 135–145)
TCO2: 16 mmol/L — ABNORMAL LOW (ref 22–32)
pCO2, Ven: 29.3 mmHg — ABNORMAL LOW (ref 44–60)
pH, Ven: 7.327 (ref 7.25–7.43)
pO2, Ven: 24 mmHg — CL (ref 32–45)

## 2023-08-26 LAB — GLUCOSE, CAPILLARY
Glucose-Capillary: 188 mg/dL — ABNORMAL HIGH (ref 70–99)
Glucose-Capillary: 175 mg/dL — ABNORMAL HIGH (ref 70–99)
Glucose-Capillary: 141 mg/dL — ABNORMAL HIGH (ref 70–99)
Glucose-Capillary: 156 mg/dL — ABNORMAL HIGH (ref 70–99)
Glucose-Capillary: 143 mg/dL — ABNORMAL HIGH (ref 70–99)
Glucose-Capillary: 99 mg/dL (ref 70–99)
Glucose-Capillary: 104 mg/dL — ABNORMAL HIGH (ref 70–99)
Glucose-Capillary: 128 mg/dL — ABNORMAL HIGH (ref 70–99)
Glucose-Capillary: 113 mg/dL — ABNORMAL HIGH (ref 70–99)

## 2023-08-26 LAB — BASIC METABOLIC PANEL WITH GFR
Anion gap: 24 — ABNORMAL HIGH (ref 5–15)
Anion gap: 12 (ref 5–15)
BUN: 44 mg/dL — ABNORMAL HIGH (ref 8–23)
BUN: 37 mg/dL — ABNORMAL HIGH (ref 8–23)
CO2: 15 mmol/L — ABNORMAL LOW (ref 22–32)
CO2: 17 mmol/L — ABNORMAL LOW (ref 22–32)
Calcium: 11.4 mg/dL — ABNORMAL HIGH (ref 8.9–10.3)
Calcium: 9 mg/dL (ref 8.9–10.3)
Chloride: 97 mmol/L — ABNORMAL LOW (ref 98–111)
Chloride: 110 mmol/L (ref 98–111)
Creatinine, Ser: 2.15 mg/dL — ABNORMAL HIGH (ref 0.61–1.24)
Creatinine, Ser: 1.47 mg/dL — ABNORMAL HIGH (ref 0.61–1.24)
GFR, Estimated: 31 mL/min — ABNORMAL LOW (ref >60)
GFR, Estimated: 48 mL/min — ABNORMAL LOW (ref >60)
Glucose, Bld: 393 mg/dL — ABNORMAL HIGH (ref 70–99)
Glucose, Bld: 102 mg/dL — ABNORMAL HIGH (ref 70–99)
Potassium: 5.2 mmol/L — ABNORMAL HIGH (ref 3.5–5.1)
Potassium: 4.1 mmol/L (ref 3.5–5.1)
Sodium: 136 mmol/L (ref 135–145)
Sodium: 139 mmol/L (ref 135–145)

## 2023-08-26 LAB — CBC WITH DIFFERENTIAL/PLATELET
Abs Immature Granulocytes: 0.08 K/uL — ABNORMAL HIGH (ref 0.00–0.07)
Basophils Absolute: 0 K/uL (ref 0.0–0.1)
Basophils Relative: 0 %
Eosinophils Absolute: 0 K/uL (ref 0.0–0.5)
Eosinophils Relative: 0 %
HCT: 46.9 % (ref 39.0–52.0)
Hemoglobin: 16.3 g/dL (ref 13.0–17.0)
Immature Granulocytes: 1 %
Lymphocytes Relative: 6 %
Lymphs Abs: 1.1 K/uL (ref 0.7–4.0)
MCH: 32.5 pg (ref 26.0–34.0)
MCHC: 34.8 g/dL (ref 30.0–36.0)
MCV: 93.6 fL (ref 80.0–100.0)
Monocytes Absolute: 0.8 K/uL (ref 0.1–1.0)
Monocytes Relative: 5 %
Neutro Abs: 15.7 K/uL — ABNORMAL HIGH (ref 1.7–7.7)
Neutrophils Relative %: 88 %
Platelets: 313 K/uL (ref 150–400)
RBC: 5.01 MIL/uL (ref 4.22–5.81)
RDW: 11.9 % (ref 11.5–15.5)
WBC: 17.8 K/uL — ABNORMAL HIGH (ref 4.0–10.5)
nRBC: 0 % (ref 0.0–0.2)

## 2023-08-26 LAB — COMPREHENSIVE METABOLIC PANEL WITH GFR
ALT: 14 U/L (ref 0–44)
AST: 15 U/L (ref 15–41)
Albumin: 4.2 g/dL (ref 3.5–5.0)
Alkaline Phosphatase: 88 U/L (ref 38–126)
Anion gap: 27 — ABNORMAL HIGH (ref 5–15)
BUN: 46 mg/dL — ABNORMAL HIGH (ref 8–23)
CO2: 13 mmol/L — ABNORMAL LOW (ref 22–32)
Calcium: 11 mg/dL — ABNORMAL HIGH (ref 8.9–10.3)
Chloride: 95 mmol/L — ABNORMAL LOW (ref 98–111)
Creatinine, Ser: 2.27 mg/dL — ABNORMAL HIGH (ref 0.61–1.24)
GFR, Estimated: 29 mL/min — ABNORMAL LOW (ref >60)
Glucose, Bld: 437 mg/dL — ABNORMAL HIGH (ref 70–99)
Potassium: 5.5 mmol/L — ABNORMAL HIGH (ref 3.5–5.1)
Sodium: 135 mmol/L (ref 135–145)
Total Bilirubin: 0.6 mg/dL (ref 0.0–1.2)
Total Protein: 7.7 g/dL (ref 6.5–8.1)

## 2023-08-26 LAB — URINALYSIS, ROUTINE W REFLEX MICROSCOPIC
Bacteria, UA: NONE SEEN
Bilirubin Urine: NEGATIVE
Glucose, UA: >1000 mg/dL — AB
Ketones, ur: 40 mg/dL — AB
Leukocytes,Ua: NEGATIVE
Nitrite: NEGATIVE
Protein, ur: NEGATIVE mg/dL
Specific Gravity, Urine: 1.029 (ref 1.005–1.030)
pH: 5 (ref 5.0–8.0)

## 2023-08-26 LAB — BETA-HYDROXYBUTYRIC ACID
Beta-Hydroxybutyric Acid: 4.87 mmol/L — ABNORMAL HIGH (ref 0.05–0.27)
Beta-Hydroxybutyric Acid: 0.08 mmol/L (ref 0.05–0.27)

## 2023-08-26 LAB — CBG MONITORING, ED
Glucose-Capillary: 379 mg/dL — ABNORMAL HIGH (ref 70–99)
Glucose-Capillary: 325 mg/dL — ABNORMAL HIGH (ref 70–99)
Glucose-Capillary: 258 mg/dL — ABNORMAL HIGH (ref 70–99)

## 2023-08-26 LAB — MRSA NEXT GEN BY PCR, NASAL: MRSA by PCR Next Gen: NOT DETECTED

## 2023-08-26 LAB — PROTIME-INR
INR: 1.1 (ref 0.8–1.2)
Prothrombin Time: 14.3 s (ref 11.4–15.2)

## 2023-08-26 LAB — SALICYLATE LEVEL: Salicylate Lvl: <7 mg/dL — ABNORMAL LOW (ref 7.0–30.0)

## 2023-08-26 LAB — LACTIC ACID, PLASMA
Lactic Acid, Venous: 2.7 mmol/L (ref 0.5–1.9)
Lactic Acid, Venous: 3.5 mmol/L (ref 0.5–1.9)
Lactic Acid, Venous: 3.1 mmol/L (ref 0.5–1.9)
Lactic Acid, Venous: 1.4 mmol/L (ref 0.5–1.9)

## 2023-08-26 MED ORDER — CHLORHEXIDINE GLUCONATE CLOTH 2 % EX PADS
6.0000 | MEDICATED_PAD | Freq: Every day | CUTANEOUS | Status: DC
  Administered 2023-08-26 – 2023-08-29 (×4): 6 via TOPICAL

## 2023-08-26 MED ORDER — FENTANYL CITRATE PF 50 MCG/ML IJ SOSY: 25.0000 ug | PREFILLED_SYRINGE | INTRAMUSCULAR | Status: DC | PRN

## 2023-08-26 MED ORDER — LACTATED RINGERS IV BOLUS
20.0000 mL/kg | Freq: Once | INTRAVENOUS | Status: AC
  Administered 2023-08-26: 1534 mL via INTRAVENOUS

## 2023-08-26 MED ORDER — LACTATED RINGERS IV SOLN: INTRAVENOUS | Status: DC

## 2023-08-26 MED ORDER — ACETAMINOPHEN 650 MG RE SUPP: 650.0000 mg | Freq: Four times a day (QID) | RECTAL | Status: DC | PRN

## 2023-08-26 MED ORDER — INSULIN REGULAR(HUMAN) IN NACL 100-0.9 UT/100ML-% IV SOLN
INTRAVENOUS | Status: DC
  Administered 2023-08-26: 9.5 [IU]/h via INTRAVENOUS
  Filled 2023-08-26: qty 100

## 2023-08-26 MED ORDER — ONDANSETRON HCL 4 MG PO TABS: 4.0000 mg | ORAL_TABLET | Freq: Four times a day (QID) | ORAL | Status: DC | PRN

## 2023-08-26 MED ORDER — LACTATED RINGERS IV BOLUS
500.0000 mL | Freq: Once | INTRAVENOUS | Status: AC
  Administered 2023-08-26: 500 mL via INTRAVENOUS

## 2023-08-26 MED ORDER — SODIUM CHLORIDE 0.9 % IV SOLN
2.0000 g | Freq: Once | INTRAVENOUS | Status: AC
  Administered 2023-08-27: 2 g via INTRAVENOUS
  Filled 2023-08-26: qty 20

## 2023-08-26 MED ORDER — MORPHINE SULFATE (PF) 4 MG/ML IV SOLN
4.0000 mg | Freq: Once | INTRAVENOUS | Status: AC
  Administered 2023-08-26: 4 mg via INTRAVENOUS
  Filled 2023-08-26: qty 1

## 2023-08-26 MED ORDER — ORAL CARE MOUTH RINSE: 15.0000 mL | OROMUCOSAL | Status: DC | PRN

## 2023-08-26 MED ORDER — ONDANSETRON HCL 4 MG/2ML IJ SOLN
4.0000 mg | Freq: Once | INTRAMUSCULAR | Status: AC
  Administered 2023-08-26: 4 mg via INTRAVENOUS
  Filled 2023-08-26: qty 2

## 2023-08-26 MED ORDER — ONDANSETRON HCL 4 MG/2ML IJ SOLN: 4.0000 mg | Freq: Four times a day (QID) | INTRAMUSCULAR | Status: DC | PRN

## 2023-08-26 MED ORDER — ACETAMINOPHEN 325 MG PO TABS
650.0000 mg | ORAL_TABLET | Freq: Four times a day (QID) | ORAL | Status: DC | PRN
  Filled 2023-08-26: qty 2

## 2023-08-26 MED ORDER — OXYCODONE HCL 5 MG PO TABS
5.0000 mg | ORAL_TABLET | Freq: Four times a day (QID) | ORAL | Status: DC | PRN
  Administered 2023-08-26: 5 mg via ORAL
  Filled 2023-08-26: qty 1

## 2023-08-26 MED ORDER — DEXTROSE 50 % IV SOLN: 0.0000 mL | INTRAVENOUS | Status: DC | PRN

## 2023-08-26 MED ORDER — FENTANYL CITRATE PF 50 MCG/ML IJ SOSY
25.0000 ug | PREFILLED_SYRINGE | INTRAMUSCULAR | Status: AC | PRN
  Administered 2023-08-26 – 2023-08-27 (×3): 25 ug via INTRAVENOUS
  Filled 2023-08-26 (×3): qty 1

## 2023-08-26 MED ORDER — FENTANYL CITRATE PF 50 MCG/ML IJ SOSY: 50.0000 ug | PREFILLED_SYRINGE | INTRAMUSCULAR | Status: DC | PRN

## 2023-08-26 MED ORDER — DEXTROSE IN LACTATED RINGERS 5 % IV SOLN: INTRAVENOUS | Status: DC

## 2023-08-26 NOTE — Consult Note (Signed)
 Urology Consult Note   Requesting Attending Physician:  Celinda Alm Lot, MD Service Providing Consult: Urology  Consulting Attending:    Reason for Consult:  left UPJ  obstruction  HPI: Luis Shaw is seen in consultation for reasons noted above at the request of Celinda Alm Lot, MD. Patient is a 78 year old male sent over from OSH for higher level of care and urologic consideration of undifferentiated left UPJ obstruction.  PMH significant for T2DM, HTN, HLD and presented to [facility] Emergency Department for back pain.  Patient was found to be in DKA.  CT A/P noted proximal left ureteral obstruction and associated hydronephrosis.  Serum creatinine of 2.27, unsure of baseline as last available lab was from November 2021.  SCr within normal range at that time.  On my arrival patient was found to be alert, oriented, and in no distress.  He is extremely hard of hearing but over well looks significantly better than his labs would suggest.  Reviewed case and plan and all questions were answered to his satisfaction  ------------------  Assessment:   78 y.o. male with left UPJ obstruction d/t unidentified mass   Recommendations: # Left UPJ obstruction Considering his DKA and ICU admission, would recommend left side percutaneous nephrostomy tube. Trend labs and continue IV rehydration. Ureteroscopy with biopsy on an outpatient basis. Urology will follow.  Case and plan discussed with Dr. Devere  Past Medical History: Past Medical History:  Diagnosis Date   Diabetes mellitus without complication ([facility])    Hyperlipidemia    Hypertension     Past Surgical History:  Past Surgical History:  Procedure Laterality Date   APPENDECTOMY     BRAIN SURGERY     COLONOSCOPY WITH PROPOFOL  N/A 05/26/2016   Procedure: COLONOSCOPY WITH PROPOFOL ;  Surgeon: Vicci Gladis POUR, MD;  Location: [facility];  Service: Endoscopy;  Laterality: N/A;    Medication: Current  Facility-Administered Medications  Medication Dose Route Frequency Provider Last Rate Last Admin   acetaminophen  (TYLENOL ) tablet 650 mg  650 mg Oral Q6H PRN Celinda Alm Lot, MD       Or   acetaminophen  (TYLENOL ) suppository 650 mg  650 mg Rectal Q6H PRN Celinda Alm Lot, MD       Chlorhexidine  Gluconate Cloth 2 % PADS 6 each  6 each Topical Daily Celinda Alm Lot, MD   6 each at 08/26/23 1417   dextrose  5 % in lactated ringers  infusion   Intravenous Continuous Dreama Longs, MD 125 mL/hr at 08/26/23 1416 New Bag at 08/26/23 1416   dextrose  50 % solution 0-50 mL  0-50 mL Intravenous PRN Dreama Longs, MD       insulin  regular, human (MYXREDLIN ) 100 units/ 100 mL infusion   Intravenous Continuous Dreama Longs, MD 2.6 mL/hr at 08/26/23 1416 2.6 Units/hr at 08/26/23 1416   lactated ringers  infusion   Intravenous Continuous Dreama Longs, MD   Paused at 08/26/23 1409   ondansetron  (ZOFRAN ) tablet 4 mg  4 mg Oral Q6H PRN Celinda Alm Lot, MD       Or   ondansetron  (ZOFRAN ) injection 4 mg  4 mg Intravenous Q6H PRN Celinda Alm Lot, MD       Oral care mouth rinse  15 mL Mouth Rinse PRN Celinda Alm Lot, MD        Allergies: Allergies  Allergen Reactions   Ciprofloxacin Itching    Full body rash     Social History: Social History   Tobacco Use   Smoking status:  Never   Smokeless tobacco: Never    Family History No family history on file.  Review of Systems  Genitourinary:  Positive for flank pain. Negative for dysuria, frequency, hematuria and urgency.     Objective   Vital signs in last 24 hours: BP (!) 163/88   Pulse 98   Temp (!) 97.5 F (36.4 C) (Axillary)   Resp 20   Wt 76.7 kg   SpO2 100%   BMI 24.96 kg/m   Physical Exam General: A&O, resting, appropriate HEENT: [place]/AT Pulmonary: Normal work of breathing Cardiovascular: no cyanosis Abdomen: Soft, NTTP, nondistended   Most Recent Labs: Lab Results  Component Value Date   WBC  17.8 (H) 08/26/2023   HGB 17.0 08/26/2023   HCT 50.0 08/26/2023   PLT 313 08/26/2023    Lab Results  Component Value Date   NA 135 08/26/2023   K 5.0 08/26/2023   CL 97 (L) 08/26/2023   CO2 15 (L) 08/26/2023   BUN 44 (H) 08/26/2023   CREATININE 2.15 (H) 08/26/2023   CALCIUM  11.4 (H) 08/26/2023    No results found for: INR, APTT   Urine Culture: @[id number] (laburin,org,[id number])@   IMAGING: CT Renal Stone Study Result Date: 08/26/2023 CLINICAL DATA:  Abdominal/flank pain, stone suspected Left lower back pain worsening since July. No known injury. History of diabetes and appendectomy. EXAM: CT ABDOMEN AND PELVIS WITHOUT CONTRAST TECHNIQUE: Multidetector CT imaging of the abdomen and pelvis was performed following the standard protocol without IV contrast. RADIATION DOSE REDUCTION: This exam was performed according to the departmental dose-optimization program which includes automated exposure control, adjustment of the mA and/or kV according to patient size and/or use of iterative reconstruction technique. COMPARISON:  Abdominopelvic CT 03/25/2011.  Chest CT 02/01/2020. FINDINGS: Lower chest: Aortic and coronary artery atherosclerosis noted. The lung bases are clear. There is a small hiatal hernia. Hepatobiliary: The liver has a non cirrhotic morphology. No suspicious focal abnormalities are identified on noncontrast imaging. No evidence of gallstones, gallbladder wall thickening or biliary dilatation. Pancreas: Unremarkable. No pancreatic ductal dilatation or surrounding inflammatory changes. Spleen: Normal in size without focal abnormality. Adrenals/Urinary Tract: Both adrenal glands appear normal. There is a punctate nonobstructing calculus in the interpolar region of the right kidney. No other urinary tract calculi are demonstrated. New moderate dilatation of the left renal pelvis and calyces with abrupt transition at the ureteropelvic junction. Associated mild asymmetric left  perinephric soft tissue stranding, suggesting a degree of acute collecting system obstruction. No right-sided hydronephrosis. There are simple and mildly complex renal cysts bilaterally for which no specific follow-up imaging is recommended. The bladder appears unremarkable for its degree of distention. Stomach/Bowel: No enteric contrast administered. The stomach appears unremarkable for its degree of distension. No evidence of bowel wall thickening, distention or surrounding inflammatory change. Mildly prominent stool in the proximal colon. Vascular/Lymphatic: There are no enlarged abdominal or pelvic lymph nodes. Aortic and branch vessel atherosclerosis without evidence of aneurysm. Retroaortic left renal vein noted. Reproductive: Mild enlargement of the prostate gland. Other: Stable mildly prominent fat in both inguinal canals. Small umbilical hernia containing only fat. No ascites or pneumoperitoneum. Musculoskeletal: No acute or significant osseous findings. Stable benign appearing sclerotic lesion in the right superior pubic ramus, likely a bone island. IMPRESSION: 1. New moderate dilatation of the left renal pelvis and calyces with abrupt transition at the ureteropelvic junction and perinephric soft tissue stranding, suspicious for acute UPJ obstruction. A specific cause is not demonstrated by this noncontrast study. Differential considerations include  recently passed calculus, blood clot and tumor (transitional cell carcinoma). Urology follow-up recommended. 2. Punctate nonobstructing right renal calculus. No other urinary tract calculi. 3. No other acute abdominopelvic findings. 4.  Aortic Atherosclerosis (ICD10-I70.0). Electronically Signed   By: Elsie Perone M.D.   On: 08/26/2023 11:25    ------  Ole Bourdon, NP Pager: [phone]   Please contact the urology consult pager with any further questions/concerns.

## 2023-08-26 NOTE — ED Notes (Signed)
 Called [facility] to transport patient to [facility] rm# [id number]

## 2023-08-26 NOTE — ED Provider Notes (Signed)
 [facility] AT [facility] Provider Note   CSN: [id number] Arrival date & time: 08/26/23  9065     Patient presents with: Back Pain   Luis Shaw is a 78 y.o. male.   HPI     78yo male with history of DM, hypertension, hyperlipidemia who presents with concern for back pain.  Started suddenly July 6th. Since then has been waxing and waning, worsening pain. No trauma, no falls, Had [place] from [place] 4 hours on the 4th.   Pain worse sitting up.  Not worse moving. Better laying down with heating pad. No numbness, no weakness, no loss of control of bowel or bladder. Urinating frequently, thinks emptying bladder.   Renal function looking worse Has been to [facility] and PCP at [facility] for this.   No fevers Yesterday began to have some nausea, hiccups, queasy  No chest pain or dyspnea  Past Medical History:  Diagnosis Date   Diabetes mellitus without complication (HCC)    Hyperlipidemia    Hypertension     Past Surgical History:  Procedure Laterality Date   APPENDECTOMY     BRAIN SURGERY     COLONOSCOPY WITH PROPOFOL  N/A 05/26/2016   Procedure: COLONOSCOPY WITH PROPOFOL ;  Surgeon: Vicci Gladis POUR, MD;  Location: [facility];  Service: Endoscopy;  Laterality: N/A;    Prior to Admission medications   Medication Sig Start Date End Date Taking? Authorizing Provider  amLODipine  (NORVASC ) 5 MG tablet Take 5 mg by mouth at bedtime. 12/07/19  Yes [provider]  aspirin EC 81 MG tablet Take 81 mg by mouth daily at 12 noon.   Yes [provider]  glimepiride (AMARYL) 1 MG tablet Take 1 mg by mouth every morning. 05/17/23  Yes [provider]  glimepiride (AMARYL) 2 MG tablet Take 2 mg by mouth daily with breakfast.   Yes [provider]  ibuprofen (ADVIL,MOTRIN) 200 MG tablet Take 400 mg by mouth daily as needed for mild pain or moderate pain.   Yes [provider]  JARDIANCE 25 MG TABS  tablet Take 25 mg by mouth daily. 12/08/19  Yes [provider]  lisinopril (PRINIVIL,ZESTRIL) 10 MG tablet Take 20 mg by mouth daily at 12 noon.    Yes [provider]  methocarbamol (ROBAXIN) 750 MG tablet Take 750 mg by mouth every 8 (eight) hours. 08/12/23  Yes [provider]  Misc Natural Products (TART CHERRY ADVANCED) CAPS Take 1 tablet by mouth daily. 12/08/19  Yes [provider]  OZEMPIC, 2 MG/DOSE, 8 MG/3ML SOPN Inject 2 mg into the skin once a week.   Yes [provider]  predniSONE (DELTASONE) 10 MG tablet See admin instructions. Day 1: 6 tablets; day 2: 5 tablets; day 3: 4 tablets: day 4: 3 tablets; day 5: 2 tablet; Day 6: 1 tablet. Orally Once a day; Duration: 6 days 08/24/23  Yes [provider]  simvastatin (ZOCOR) 80 MG tablet Take 80 mg by mouth at bedtime. 12/07/19  Yes [provider]  prochlorperazine  (COMPAZINE ) 10 MG tablet Take 1 tablet (10 mg total) by mouth every 6 (six) hours as needed (hiccups). Patient not taking: Reported on 08/26/2023 12/12/19   Odell Balls, PA-C    Allergies: Ciprofloxacin    Review of Systems  Updated Vital Signs BP (!) 162/88   Pulse 95   Temp 98.6 F (37 C) (Oral)   Resp 14   Ht 5' 8 (1.727 m)  Wt 76.8 kg   SpO2 92%   BMI 25.74 kg/m   Physical Exam Vitals and nursing note reviewed.  Constitutional:      General: He is not in acute distress.    Appearance: He is well-developed. He is not diaphoretic.  HENT:     Head: Normocephalic and atraumatic.  Eyes:     Conjunctiva/sclera: Conjunctivae normal.  Cardiovascular:     Rate and Rhythm: Normal rate and regular rhythm.     Pulses: Normal pulses.     Heart sounds: Normal heart sounds. No murmur heard.    No friction rub. No gallop.  Pulmonary:     Effort: Pulmonary effort is normal. No respiratory distress.     Breath sounds: Normal breath sounds. No wheezing or rales.  Abdominal:     General: There is no  distension.     Palpations: Abdomen is soft.     Tenderness: There is abdominal tenderness (left sided). There is left CVA tenderness. There is no guarding.  Musculoskeletal:        General: Tenderness (left flank) present.     Cervical back: Normal range of motion.  Skin:    General: Skin is warm and dry.  Neurological:     Mental Status: He is alert and oriented to person, place, and time.     (all labs ordered are listed, but only abnormal results are displayed) Labs Reviewed  CBC WITH DIFFERENTIAL/PLATELET - Abnormal; Notable for the following components:      Result Value   WBC 17.8 (*)    Neutro Abs 15.7 (*)    Abs Immature Granulocytes 0.08 (*)    All other components within normal limits  COMPREHENSIVE METABOLIC PANEL WITH GFR - Abnormal; Notable for the following components:   Potassium 5.5 (*)    Chloride 95 (*)    CO2 13 (*)    Glucose, Bld 437 (*)    BUN 46 (*)    Creatinine, Ser 2.27 (*)    Calcium  11.0 (*)    GFR, Estimated 29 (*)    Anion gap 27 (*)    All other components within normal limits  URINALYSIS, ROUTINE W REFLEX MICROSCOPIC - Abnormal; Notable for the following components:   Glucose, UA >1,000 (*)    Hgb urine dipstick SMALL (*)    Ketones, ur 40 (*)    All other components within normal limits  LACTIC ACID, PLASMA - Abnormal; Notable for the following components:   Lactic Acid, Venous 2.7 (*)    All other components within normal limits  LACTIC ACID, PLASMA - Abnormal; Notable for the following components:   Lactic Acid, Venous 3.5 (*)    All other components within normal limits  SALICYLATE LEVEL - Abnormal; Notable for the following components:   Salicylate Lvl <7.0 (*)    All other components within normal limits  BASIC METABOLIC PANEL WITH GFR - Abnormal; Notable for the following components:   Potassium 5.2 (*)    Chloride 97 (*)    CO2 15 (*)    Glucose, Bld 393 (*)    BUN 44 (*)    Creatinine, Ser 2.15 (*)    Calcium  11.4 (*)     GFR, Estimated 31 (*)    Anion gap 24 (*)    All other components within normal limits  BETA-HYDROXYBUTYRIC ACID - Abnormal; Notable for the following components:   Beta-Hydroxybutyric Acid 4.87 (*)    All other components within normal limits  GLUCOSE, CAPILLARY -  Abnormal; Notable for the following components:   Glucose-Capillary 188 (*)    All other components within normal limits  LACTIC ACID, PLASMA - Abnormal; Notable for the following components:   Lactic Acid, Venous 3.1 (*)    All other components within normal limits  GLUCOSE, CAPILLARY - Abnormal; Notable for the following components:   Glucose-Capillary 175 (*)    All other components within normal limits  GLUCOSE, CAPILLARY - Abnormal; Notable for the following components:   Glucose-Capillary 141 (*)    All other components within normal limits  GLUCOSE, CAPILLARY - Abnormal; Notable for the following components:   Glucose-Capillary 156 (*)    All other components within normal limits  GLUCOSE, CAPILLARY - Abnormal; Notable for the following components:   Glucose-Capillary 143 (*)    All other components within normal limits  I-STAT VENOUS BLOOD GAS, ED - Abnormal; Notable for the following components:   pCO2, Ven 29.3 (*)    pO2, Ven 24 (*)    Bicarbonate 15.5 (*)    TCO2 16 (*)    Acid-base deficit 9.0 (*)    All other components within normal limits  CBG MONITORING, ED - Abnormal; Notable for the following components:   Glucose-Capillary 379 (*)    All other components within normal limits  CBG MONITORING, ED - Abnormal; Notable for the following components:   Glucose-Capillary 325 (*)    All other components within normal limits  CBG MONITORING, ED - Abnormal; Notable for the following components:   Glucose-Capillary 258 (*)    All other components within normal limits  MRSA NEXT GEN BY PCR, NASAL  URINE CULTURE  PROTIME-INR  LACTIC ACID, PLASMA  GLUCOSE, CAPILLARY  CBC  COMPREHENSIVE METABOLIC PANEL  WITH GFR  BASIC METABOLIC PANEL WITH GFR  BASIC METABOLIC PANEL WITH GFR  BETA-HYDROXYBUTYRIC ACID  I-STAT VENOUS BLOOD GAS, ED    EKG: EKG Interpretation Date/Time:  Thursday August 26 2023 11:29:50 EDT Ventricular Rate:  105 PR Interval:  155 QRS Duration:  90 QT Interval:  295 QTC Calculation: 390 R Axis:   94  Text Interpretation: Sinus tachycardia Right axis deviation Low voltage, precordial leads Borderline repolarization abnormality No significant change since last tracing Confirmed by Dreama Longs ([id number]) on 08/26/2023 11:33:44 AM  Radiology: [facility] Chest Port 1 View Result Date: 08/26/2023 CLINICAL DATA:  Acute dyspnea. EXAM: PORTABLE CHEST 1 VIEW COMPARISON:  December 11, 2019. FINDINGS: The heart size and mediastinal contours are within normal limits. Minimal scarring is noted in right upper lobe. No acute pulmonary disease is noted. The visualized skeletal structures are unremarkable. IMPRESSION: No active disease. Electronically Signed   By: Lynwood Landy Raddle M.D.   On: 08/26/2023 14:44   CT Renal Stone Study Result Date: 08/26/2023 CLINICAL DATA:  Abdominal/flank pain, stone suspected Left lower back pain worsening since July. No known injury. History of diabetes and appendectomy. EXAM: CT ABDOMEN AND PELVIS WITHOUT CONTRAST TECHNIQUE: Multidetector CT imaging of the abdomen and pelvis was performed following the standard protocol without IV contrast. RADIATION DOSE REDUCTION: This exam was performed according to the departmental dose-optimization program which includes automated exposure control, adjustment of the mA and/or kV according to patient size and/or use of iterative reconstruction technique. COMPARISON:  Abdominopelvic CT 03/25/2011.  Chest CT 02/01/2020. FINDINGS: Lower chest: Aortic and coronary artery atherosclerosis noted. The lung bases are clear. There is a small hiatal hernia. Hepatobiliary: The liver has a non cirrhotic morphology. No suspicious focal abnormalities  are identified on noncontrast  imaging. No evidence of gallstones, gallbladder wall thickening or biliary dilatation. Pancreas: Unremarkable. No pancreatic ductal dilatation or surrounding inflammatory changes. Spleen: Normal in size without focal abnormality. Adrenals/Urinary Tract: Both adrenal glands appear normal. There is a punctate nonobstructing calculus in the interpolar region of the right kidney. No other urinary tract calculi are demonstrated. New moderate dilatation of the left renal pelvis and calyces with abrupt transition at the ureteropelvic junction. Associated mild asymmetric left perinephric soft tissue stranding, suggesting a degree of acute collecting system obstruction. No right-sided hydronephrosis. There are simple and mildly complex renal cysts bilaterally for which no specific follow-up imaging is recommended. The bladder appears unremarkable for its degree of distention. Stomach/Bowel: No enteric contrast administered. The stomach appears unremarkable for its degree of distension. No evidence of bowel wall thickening, distention or surrounding inflammatory change. Mildly prominent stool in the proximal colon. Vascular/Lymphatic: There are no enlarged abdominal or pelvic lymph nodes. Aortic and branch vessel atherosclerosis without evidence of aneurysm. Retroaortic left renal vein noted. Reproductive: Mild enlargement of the prostate gland. Other: Stable mildly prominent fat in both inguinal canals. Small umbilical hernia containing only fat. No ascites or pneumoperitoneum. Musculoskeletal: No acute or significant osseous findings. Stable benign appearing sclerotic lesion in the right superior pubic ramus, likely a bone island. IMPRESSION: 1. New moderate dilatation of the left renal pelvis and calyces with abrupt transition at the ureteropelvic junction and perinephric soft tissue stranding, suspicious for acute UPJ obstruction. A specific cause is not demonstrated by this noncontrast  study. Differential considerations include recently passed calculus, blood clot and tumor (transitional cell carcinoma). Urology follow-up recommended. 2. Punctate nonobstructing right renal calculus. No other urinary tract calculi. 3. No other acute abdominopelvic findings. 4.  Aortic Atherosclerosis (ICD10-I70.0). Electronically Signed   By: Elsie Perone M.D.   On: 08/26/2023 11:25     .Critical Care  Performed by: Dreama Longs, MD Authorized by: Dreama Longs, MD   Critical care provider statement:    Critical care time (minutes):  30   Critical care was time spent personally by me on the following activities:  Development of treatment plan with patient or surrogate, discussions with consultants, evaluation of patient's response to treatment, examination of patient, ordering and review of laboratory studies, ordering and review of radiographic studies, ordering and performing treatments and interventions, pulse oximetry, re-evaluation of patient's condition and review of old charts    Medications Ordered in the ED  insulin  regular, human (MYXREDLIN ) 100 units/ 100 mL infusion (2.6 Units/hr Intravenous Infusion Verify 08/26/23 1416)  lactated ringers  infusion ( Intravenous Paused 08/26/23 1409)  dextrose  5 % in lactated ringers  infusion ( Intravenous New Bag/Given 08/26/23 1416)  dextrose  50 % solution 0-50 mL (has no administration in time range)  Chlorhexidine  Gluconate Cloth 2 % PADS 6 each (6 each Topical Given 08/26/23 1417)  Oral care mouth rinse (has no administration in time range)  acetaminophen  (TYLENOL ) tablet 650 mg (has no administration in time range)    Or  acetaminophen  (TYLENOL ) suppository 650 mg (has no administration in time range)  ondansetron  (ZOFRAN ) tablet 4 mg (has no administration in time range)    Or  ondansetron  (ZOFRAN ) injection 4 mg (has no administration in time range)  oxyCODONE  (Oxy IR/ROXICODONE ) immediate release tablet 5 mg (5 mg Oral Given 08/26/23  1644)  fentaNYL  (SUBLIMAZE ) injection 25 mcg (has no administration in time range)  cefTRIAXone  (ROCEPHIN ) 2 g in sodium chloride  0.9 % 100 mL IVPB (has no administration in time range)  morphine  (PF) 4 MG/ML injection 4 mg (4 mg Intravenous Given 08/26/23 1036)  ondansetron  (ZOFRAN ) injection 4 mg (4 mg Intravenous Given 08/26/23 1032)  lactated ringers  bolus 500 mL (0 mLs Intravenous Stopped 08/26/23 1202)  lactated ringers  bolus 1,534 mL (0 mLs Intravenous Stopped 08/26/23 1321)                                     77yo male with history of DM, hypertension, hyperlipidemia who presents with concern for back pain.  Differential diagnosis includes dissection, AAA, nephrolithiasis, pyelonephritis, degenerative disc disease.  He has normal bilateral lower extremity pulses, low suspicion for acute arterial thrombus, low clinical suspicion for aortic dissection given duration of symptoms.  No signs of cauda equina, epidural abscess, or osteomyelitis.  Labs completed personally by and interpreted by me show glucose ptosis of 17,000, AKI with a creatinine of 2.27 from previous of 1.17 last in our system in 2021, elevated BUN 46, hyperglycemia 437 with a bicarb of 13 and anion gap of 27 concerning for possible diabetic ketoacidosis.  Has mild hyperkalemia with possible hemolysis.  Started on insulin  gtt.  Ketones present in urine, beta hydroxybutyric acid elevated consistent with DKA. Also noted lactic acidosis.  CT renal stone study completed and shows new dilation left renal pelvis and calyces with abrupt transition at the uteropelvic junction and perinphric soft tissue stranding, suspicious for UPJ obstruction.  Urology, Dr. Delia consulted and will see at [facility]. Recommends remains NPO until his evaluation.  Admitted with concern for DKA and UPJ obstruction.      Final diagnoses:  Diabetic ketoacidosis without coma associated with type 2 diabetes mellitus (HCC)  AKI (acute kidney injury) (HCC)   UPJ obstruction, acquired    ED Discharge Orders     None          Dreama Longs, MD 08/26/23 2042

## 2023-08-26 NOTE — Plan of Care (Signed)
  Problem: Clinical Measurements: Goal: Will remain free from infection Outcome: Progressing Goal: Diagnostic test results will improve Outcome: Progressing   Problem: Elimination: Goal: Will not experience complications related to urinary retention Outcome: Progressing   Problem: Safety: Goal: Ability to remain free from injury will improve Outcome: Progressing   Problem: Skin Integrity: Goal: Risk for impaired skin integrity will decrease Outcome: Progressing   Problem: Respiratory: Goal: Will regain and/or maintain adequate ventilation Outcome: Progressing

## 2023-08-26 NOTE — Consult Note (Signed)
 Chief Complaint: Abdominal/left flank pain; proximal left ureteral obstruction, hydronephrosis, elevated creatinine; referred for image guided left percutaneous nephrostomy  Referring Provider(s): Winter,C  Supervising Physician: Jennefer Rover  Patient Status: [facility] - In-pt  History of Present Illness: Luis Shaw is a 78 y.o. male with past medical history significant for diabetes, hyperlipidemia, hypertension who was recently transferred from [facility] emergency room for persistent abdominal/left flank discomfort, DKA.  CT abdomen pelvis revealed:  1. New moderate dilatation of the left renal pelvis and calyces with abrupt transition at the ureteropelvic junction and perinephric soft tissue stranding, suspicious for acute UPJ obstruction. A specific cause is not demonstrated by this noncontrast study. Differential considerations include recently passed calculus, blood clot and tumor (transitional cell carcinoma). Urology follow-up recommended. 2. Punctate nonobstructing right renal calculus. No other urinary tract calculi. 3. No other acute abdominopelvic findings. 4.  Aortic Atherosclerosis  He is afebrile, WBC 17.8, hemoglobin normal, platelets normal, creatinine 2.15, PT/INR normal, urine culture pending; patient seen by urology and request now received for left percutaneous nephrostomy.    Patient is Full Code  Past Medical History:  Diagnosis Date   Diabetes mellitus without complication (HCC)    Hyperlipidemia    Hypertension     Past Surgical History:  Procedure Laterality Date   APPENDECTOMY     BRAIN SURGERY     COLONOSCOPY WITH PROPOFOL  N/A 05/26/2016   Procedure: COLONOSCOPY WITH PROPOFOL ;  Surgeon: Vicci Gladis POUR, MD;  Location: [facility] ENDOSCOPY;  Service: Endoscopy;  Laterality: N/A;    Allergies: Ciprofloxacin  Medications: Prior to Admission medications   Medication Sig Start Date End Date Taking? Authorizing Provider  amLODipine  (NORVASC ) 5 MG  tablet Take 5 mg by mouth at bedtime. 12/07/19  Yes [provider]  aspirin EC 81 MG tablet Take 81 mg by mouth daily at 12 noon.   Yes [provider]  glimepiride (AMARYL) 1 MG tablet Take 1 mg by mouth every morning. 05/17/23  Yes [provider]  glimepiride (AMARYL) 2 MG tablet Take 2 mg by mouth daily with breakfast.   Yes [provider]  ibuprofen (ADVIL,MOTRIN) 200 MG tablet Take 400 mg by mouth daily as needed for mild pain or moderate pain.   Yes [provider]  JARDIANCE 25 MG TABS tablet Take 25 mg by mouth daily. 12/08/19  Yes [provider]  lisinopril (PRINIVIL,ZESTRIL) 10 MG tablet Take 20 mg by mouth daily at 12 noon.    Yes [provider]  methocarbamol (ROBAXIN) 750 MG tablet Take 750 mg by mouth every 8 (eight) hours. 08/12/23  Yes [provider]  Misc Natural Products (TART CHERRY ADVANCED) CAPS Take 1 tablet by mouth daily. 12/08/19  Yes [provider]  OZEMPIC, 2 MG/DOSE, 8 MG/3ML SOPN Inject 2 mg into the skin once a week.   Yes [provider]  predniSONE (DELTASONE) 10 MG tablet See admin instructions. Day 1: 6 tablets; day 2: 5 tablets; day 3: 4 tablets: day 4: 3 tablets; day 5: 2 tablet; Day 6: 1 tablet. Orally Once a day; Duration: 6 days 08/24/23  Yes [provider]  simvastatin (ZOCOR) 80 MG tablet Take 80 mg by mouth at bedtime. 12/07/19  Yes [provider]  prochlorperazine  (COMPAZINE ) 10 MG tablet Take 1 tablet (10 mg total) by mouth every 6 (six) hours as needed (hiccups). Patient not taking: Reported on 08/26/2023 12/12/19   Odell Balls, PA-C     No family history on file.  Social History   Socioeconomic History   Marital status: Married    Spouse name: Not on file   Number of children: Not on file   Years of education: Not on file   Highest education level: Not on file  Occupational History   Not on file  Tobacco Use   Smoking status:  Never   Smokeless tobacco: Never  Substance and Sexual Activity   Alcohol use: Not on file   Drug use: Not on file   Sexual activity: Not on file  Other Topics Concern   Not on file  Social History Narrative   Not on file   Social Drivers of Health   Financial Resource Strain: Not on file  Food Insecurity: No Food Insecurity (08/26/2023)   Hunger Vital Sign    Worried About Running Out of Food in the Last Year: Never true    Ran Out of Food in the Last Year: Never true  Transportation Needs: No Transportation Needs (08/26/2023)   PRAPARE - Administrator, Civil Service (Medical): No    Lack of Transportation (Non-Medical): No  Physical Activity: Not on file  Stress: Not on file  Social Connections: Unknown (08/26/2023)   Social Connection and Isolation Panel    Frequency of Communication with Friends and Family: Three times a week    Frequency of Social Gatherings with Friends and Family: Twice a week    Attends Religious Services: Patient declined    Database administrator or Organizations: Patient declined    Attends Banker Meetings: Patient declined    Marital Status: Married       Review of Systems currently denies fever, headache, chest pain, dyspnea, cough, nausea, vomiting, hematuria, dysuria.  He continues to have some left-sided abdominal/flank discomfort  Vital Signs: BP (!) 162/88   Pulse 97   Temp (!) 97.4 F (36.3 C) (Oral)   Resp 18   Ht 5' 8 (1.727 m)   Wt 169 lb 5 oz (76.8 kg)   SpO2 94%   BMI 25.74 kg/m   Advance Care Plan: No documents on file  Physical Exam awake, alert.  Chest clear to auscultation bilaterally.  Heart with regular rate and rhythm.  Abdomen soft, positive bowel sounds, some mildly tender left lateral abdominal/CVA tenderness to palpation ; no lower extremity edema  Imaging: DG Chest Port 1 View Result Date: 08/26/2023 CLINICAL DATA:  Acute dyspnea. EXAM: PORTABLE CHEST 1 VIEW COMPARISON:  December 11, 2019. FINDINGS: The heart size and mediastinal contours are within normal limits. Minimal scarring is noted in right upper lobe. No acute pulmonary disease is noted. The visualized skeletal structures are unremarkable. IMPRESSION: No active disease. Electronically Signed   By: Lynwood Landy Raddle M.D.   On: 08/26/2023 14:44   CT Renal Stone Study Result Date: 08/26/2023 CLINICAL DATA:  Abdominal/flank pain, stone suspected Left lower back pain worsening since July. No known injury. History of diabetes and appendectomy. EXAM: CT ABDOMEN AND PELVIS WITHOUT CONTRAST TECHNIQUE: Multidetector CT imaging of the abdomen and pelvis was performed following the standard protocol without IV contrast. RADIATION DOSE REDUCTION: This exam was performed according to the departmental dose-optimization program which includes automated exposure control, adjustment of the mA and/or kV according to patient size and/or use of iterative reconstruction technique. COMPARISON:  Abdominopelvic CT 03/25/2011.  Chest CT 02/01/2020. FINDINGS: Lower chest: Aortic and coronary artery atherosclerosis noted. The lung bases are clear. There is a small hiatal hernia. Hepatobiliary: The liver  has a non cirrhotic morphology. No suspicious focal abnormalities are identified on noncontrast imaging. No evidence of gallstones, gallbladder wall thickening or biliary dilatation. Pancreas: Unremarkable. No pancreatic ductal dilatation or surrounding inflammatory changes. Spleen: Normal in size without focal abnormality. Adrenals/Urinary Tract: Both adrenal glands appear normal. There is a punctate nonobstructing calculus in the interpolar region of the right kidney. No other urinary tract calculi are demonstrated. New moderate dilatation of the left renal pelvis and calyces with abrupt transition at the ureteropelvic junction. Associated mild asymmetric left perinephric soft tissue stranding, suggesting a degree of acute collecting system obstruction. No  right-sided hydronephrosis. There are simple and mildly complex renal cysts bilaterally for which no specific follow-up imaging is recommended. The bladder appears unremarkable for its degree of distention. Stomach/Bowel: No enteric contrast administered. The stomach appears unremarkable for its degree of distension. No evidence of bowel wall thickening, distention or surrounding inflammatory change. Mildly prominent stool in the proximal colon. Vascular/Lymphatic: There are no enlarged abdominal or pelvic lymph nodes. Aortic and branch vessel atherosclerosis without evidence of aneurysm. Retroaortic left renal vein noted. Reproductive: Mild enlargement of the prostate gland. Other: Stable mildly prominent fat in both inguinal canals. Small umbilical hernia containing only fat. No ascites or pneumoperitoneum. Musculoskeletal: No acute or significant osseous findings. Stable benign appearing sclerotic lesion in the right superior pubic ramus, likely a bone island. IMPRESSION: 1. New moderate dilatation of the left renal pelvis and calyces with abrupt transition at the ureteropelvic junction and perinephric soft tissue stranding, suspicious for acute UPJ obstruction. A specific cause is not demonstrated by this noncontrast study. Differential considerations include recently passed calculus, blood clot and tumor (transitional cell carcinoma). Urology follow-up recommended. 2. Punctate nonobstructing right renal calculus. No other urinary tract calculi. 3. No other acute abdominopelvic findings. 4.  Aortic Atherosclerosis (ICD10-I70.0). Electronically Signed   By: Elsie Perone M.D.   On: 08/26/2023 11:25    Labs:  CBC: Recent Labs    08/26/23 1017 08/26/23 1135  WBC 17.8*  --   HGB 16.3 17.0  HCT 46.9 50.0  PLT 313  --     COAGS: Recent Labs    08/26/23 1510  INR 1.1    BMP: Recent Labs    08/26/23 1017 08/26/23 1121 08/26/23 1135  NA 135 136 135  K 5.5* 5.2* 5.0  CL 95* 97*  --   CO2  13* 15*  --   GLUCOSE 437* 393*  --   BUN 46* 44*  --   CALCIUM  11.0* 11.4*  --   CREATININE 2.27* 2.15*  --   GFRNONAA 29* 31*  --     LIVER FUNCTION TESTS: Recent Labs    08/26/23 1017  BILITOT 0.6  AST 15  ALT 14  ALKPHOS 88  PROT 7.7  ALBUMIN 4.2    TUMOR MARKERS: No results for input(s): AFPTM, CEA, CA199, CHROMGRNA in the last 8760 hours.  Assessment and Plan: 78 y.o. male with past medical history significant for diabetes, hyperlipidemia, hypertension who was recently transferred from [facility] emergency room for persistent abdominal/left flank discomfort, DKA.  CT abdomen pelvis revealed:  1. New moderate dilatation of the left renal pelvis and calyces with abrupt transition at the ureteropelvic junction and perinephric soft tissue stranding, suspicious for acute UPJ obstruction. A specific cause is not demonstrated by this noncontrast study. Differential considerations include recently passed calculus, blood clot and tumor (transitional cell carcinoma). Urology follow-up recommended. 2. Punctate nonobstructing right renal calculus. No other urinary tract  calculi. 3. No other acute abdominopelvic findings. 4.  Aortic Atherosclerosis  He is afebrile, not tachycardic, WBC 17.8, hemoglobin normal, platelets normal, creatinine 2.15, PT/INR normal, urine culture pending; patient seen by urology and request now received for left percutaneous nephrostomy.  Imaging studies were reviewed by Dr. Jennefer.Risks and benefits of left PCN placement was discussed with the patient including, but not limited to, infection, bleeding, significant bleeding causing loss or decrease in renal function or damage to adjacent structures.   All of the patient's questions were answered, patient is agreeable to proceed.  Consent signed and in chart.  Procedure scheduled for 8/8    Thank you for allowing our service to participate in Luis Shaw 's care.  Electronically  Signed: D. Franky Rakers, PA-C   08/26/2023, 4:20 PM      I spent a total of  40 minutes   in face to face in clinical consultation, greater than 50% of which was counseling/coordinating care for left percutaneous nephrostomy

## 2023-08-26 NOTE — Inpatient Diabetes Management (Signed)
 Inpatient Diabetes Program Recommendations  AACE/ADA: New Consensus Statement on Inpatient Glycemic Control (2015)  Target Ranges:  Prepandial:   less than 140 mg/dL      Peak postprandial:   less than 180 mg/dL (1-2 hours)      Critically ill patients:  140 - 180 mg/dL    Latest Reference Range & Units 08/26/23 10:17  Sodium 135 - 145 mmol/L 135  Potassium 3.5 - 5.1 mmol/L 5.5 (H)  Chloride 98 - 111 mmol/L 95 (L)  CO2 22 - 32 mmol/L 13 (L)  Glucose 70 - 99 mg/dL 562 (H)  BUN 8 - 23 mg/dL 46 (H)  Creatinine 9.38 - 1.24 mg/dL 7.72 (H)  Calcium  8.9 - 10.3 mg/dL 88.9 (H)  Anion gap 5 - 15  27 (H)  (H): Data is abnormally high (L): Data is abnormally low  Latest Reference Range & Units 08/26/23 11:28 08/26/23 11:47 08/26/23 12:53  Glucose-Capillary 70 - 99 mg/dL 620 (H) 674 (H)  IV Insulin  Drip Started 258 (H)  (H): Data is abnormally high  To [facility] ED with Back Pain--Found to be in DKA  History: DM  Home DM Meds: Jardiance 25 mg daily + Amaryl 2 mg daily + Ozempic 2 mg Qweek   Current Orders: IV Insulin  Drip    Note IV Insulin  Drip started at 11:50 Note pt takes Jardiance at home It may take longer for acidosis to clear given he has Jardiance on board May need to stop Jardiance at time of d/c     --Will follow patient during hospitalization--  Adina Rudolpho Arrow RN, MSN, CDCES Diabetes Coordinator Inpatient Glycemic Control Team Team Pager: [phone] (8a-5p)

## 2023-08-26 NOTE — H&P (Signed)
 History and Physical    Patient: Luis Shaw FMW:[id number] DOB: 1945/06/16 DOA: 08/26/2023 DOS: the patient was seen and examined on 08/26/2023 PCP: Charlott Dorn LABOR, MD  Patient coming from: Home  Chief Complaint:  Chief Complaint  Patient presents with   Back Pain   HPI: Luis Shaw is a 78 y.o. male with medical history significant of TYPE 2 DM, hyperlipidemia, hypertension who presented to the emergency department complaints of left-sided abdominal and left flank pain for the past 3 weeks that has been getting progressively worse. He also complaining of numbness and tingling in his extremities.  Incidentally he was found to be in DKA.  Patient then stated that he has been having polyuria, polydipsia for several weeks.  He was seen for these pain at their urgent care center as he thought that he was back pain from a long drive.  He was also recently prescribed insulin  but has not started it yet. He denied fever, chills, rhinorrhea, sore throat, wheezing or hemoptysis.  No chest pain, palpitations, diaphoresis, PND, orthopnea or pitting edema of the lower extremities. No abdominal pain, nausea, emesis, diarrhea, constipation, melena or hematochezia. No flank pain, dysuria, frequency or hematuria.   Lab work: Urinalysis showed glucose of greater than 1000 and ketones of 40 mg/dL with small hemoglobin.  Venous pH was 7.327, PCO2 29.3 and PO2 24 mmHg.  Bicarbonate was 15.5 T CO2 16 mmol/L.  CBC showed white count of 17.9, hemoglobin 16.3 g/dL platelets 686.  CMP showed a sodium 135, potassium 5.5, chloride 95 CO2 13 mmol/L.  Glucose 437, BUN 46, creatinine 2.27 and calcium  11.0 mg/dL.  LFTs were normal.  Lactic acid is 2.7 and 3.5 mg deciliter.  Beta hydroxybutyric acid was 4.87 mmol/L.  Imaging: CT renal stone showing new moderate dilatation of the left renal pelvis and calyces with With transition at the ureteropelvic junction and perinephric soft tissue stranding, suspicious for  acute UPJ obstruction.  A specific cause is not demonstrated by this noncontrast study.  Differential considerations include recently passed calculus, blood clot and transitional cell carcinoma.  Urology consult recommended.  There is punctate nonobstructing right renal calculus.  Aortic atherosclerosis.  There were no other acute abdominopelvic findings.   ED course: Initial vital signs were temperature 97.5 F, pulse 98, respiration 20, BP 163/88 mmHg O2 sat 100% on room air.  Patient received IV fluids, insulin  infusion, morphine  4 mg IVP and ondansetron  4 mg IVP.  Review of Systems: As mentioned in the history of present illness. All other systems reviewed and are negative. Past Medical History:  Diagnosis Date   Diabetes mellitus without complication (HCC)    Hyperlipidemia    Hypertension    Past Surgical History:  Procedure Laterality Date   APPENDECTOMY     BRAIN SURGERY     COLONOSCOPY WITH PROPOFOL  N/A 05/26/2016   Procedure: COLONOSCOPY WITH PROPOFOL ;  Surgeon: Vicci Gladis POUR, MD;  Location: [facility];  Service: Endoscopy;  Laterality: N/A;   Social History:  reports that he has never smoked. He has never used smokeless tobacco. No history on file for alcohol use and drug use.  Allergies  Allergen Reactions   Ciprofloxacin Itching    Full body rash     No family history on file.  Prior to Admission medications   Medication Sig Start Date End Date Taking? Authorizing Provider  amLODipine  (NORVASC ) 5 MG tablet Take 5 mg by mouth at bedtime. 12/07/19  Yes [provider]  aspirin EC  81 MG tablet Take 81 mg by mouth daily at 12 noon.   Yes [provider]  glimepiride (AMARYL) 1 MG tablet Take 1 mg by mouth every morning. 05/17/23  Yes [provider]  glimepiride (AMARYL) 2 MG tablet Take 2 mg by mouth daily with breakfast.   Yes [provider]  ibuprofen (ADVIL,MOTRIN) 200 MG tablet Take 400 mg by mouth daily as needed for mild pain  or moderate pain.   Yes [provider]  JARDIANCE 25 MG TABS tablet Take 25 mg by mouth daily. 12/08/19  Yes [provider]  lisinopril (PRINIVIL,ZESTRIL) 10 MG tablet Take 20 mg by mouth daily at 12 noon.    Yes [provider]  methocarbamol (ROBAXIN) 750 MG tablet Take 750 mg by mouth every 8 (eight) hours. 08/12/23  Yes [provider]  Misc Natural Products (TART CHERRY ADVANCED) CAPS Take 1 tablet by mouth daily. 12/08/19  Yes [provider]  OZEMPIC, 2 MG/DOSE, 8 MG/3ML SOPN Inject 2 mg into the skin once a week.   Yes [provider]  predniSONE (DELTASONE) 10 MG tablet See admin instructions. Day 1: 6 tablets; day 2: 5 tablets; day 3: 4 tablets: day 4: 3 tablets; day 5: 2 tablet; Day 6: 1 tablet. Orally Once a day; Duration: 6 days 08/24/23  Yes [provider]  simvastatin (ZOCOR) 80 MG tablet Take 80 mg by mouth at bedtime. 12/07/19  Yes [provider]  prochlorperazine  (COMPAZINE ) 10 MG tablet Take 1 tablet (10 mg total) by mouth every 6 (six) hours as needed (hiccups). Patient not taking: Reported on 08/26/2023 12/12/19   Odell Balls, PA-C    Physical Exam: Vitals:   08/26/23 0943 08/26/23 0945 08/26/23 1123  BP:  (!) 163/88   Pulse:  98   Resp:  20   Temp: (!) 97.5 F (36.4 C)    TempSrc: Axillary    SpO2:  100%   Weight:   76.7 kg   Physical Exam Vitals and nursing note reviewed.  Constitutional:      General: He is awake. He is not in acute distress.    Appearance: He is ill-appearing.  HENT:     Head: Normocephalic.     Nose: No rhinorrhea.     Mouth/Throat:     Mouth: Mucous membranes are dry.  Eyes:     General: No scleral icterus.    Pupils: Pupils are equal, round, and reactive to light.  Neck:     Vascular: No JVD.  Cardiovascular:     Rate and Rhythm: Normal rate and regular rhythm.     Heart sounds: S1 normal and S2 normal.  Pulmonary:     Effort: Pulmonary effort is normal.      Breath sounds: Normal breath sounds. No wheezing, rhonchi or rales.  Abdominal:     General: Bowel sounds are normal. There is no distension.     Palpations: Abdomen is soft.     Tenderness: There is no abdominal tenderness. There is no right CVA tenderness.  Musculoskeletal:     Cervical back: Neck supple.     Right lower leg: No edema.     Left lower leg: No edema.  Skin:    General: Skin is warm and dry.  Neurological:     General: No focal deficit present.     Mental Status: He is alert and oriented to person, place, and time.  Psychiatric:        Mood and  Affect: Mood normal.        Behavior: Behavior normal. Behavior is cooperative.     Data Reviewed:  Results are pending, will review when available.  EKG: Vent. rate 105 BPM PR interval 155 ms QRS duration 90 ms QT/QTcB 295/390 ms P-R-T axes 0 94 -69 Sinus tachycardia Right axis deviation Low voltage, precordial leads Borderline repolarization abnormality  Assessment and Plan: Principal Problem:   DKA (diabetic ketoacidosis) (HCC) Observation/stepdown. Keep NPO. Continue IV fluids. Continue insulin  infusion. Monitor CBG closely. BMP every 4 hours. BHA every 8 hours. Replace electrolytes as needed. Consult diabetes coordinator. Transition to SQ insulin  per Endo tool.  Active Problems:   Hyperkalemia Continue IV fluids and insulin .    Hypercalcemia In the setting of dehydration. Follow-up after IV fluids in AM.    Leukocytosis  No fever or clear signs of infection. Continue IV fluids. Check CBC in AM.    Elevated serum creatinine Likely due to:   Dehydration No recent baseline to see if it is AKI. Continue IV fluids. Monitor intake and output. Follow-up renal function in AM.    Obstruction of left ureteropelvic junction (UPJ)  Urology consult appreciated. Will undergo nephrostomy. Direct visualization and biopsy as an outpatient.   Advance Care Planning:   Code Status: Full Code    Consults:   Family Communication:   Severity of Illness: The appropriate patient status for this patient is OBSERVATION. Observation status is judged to be reasonable and necessary in order to provide the required intensity of service to ensure the patient's safety. The patient's presenting symptoms, physical exam findings, and initial radiographic and laboratory data in the context of their medical condition is felt to place them at decreased risk for further clinical deterioration. Furthermore, it is anticipated that the patient will be medically stable for discharge from the hospital within 2 midnights of admission.   Author: Alm Dorn Castor, MD 08/26/2023 2:01 PM  For on call review [email or web address].   This document was prepared using Dragon voice recognition software and may contain some unintended transcription errors.

## 2023-08-26 NOTE — Progress Notes (Signed)
 Plan of Care Note for accepted transfer   Patient: Luis Shaw MRN: [id number]   DOA: 08/26/2023  Facility requesting transfer: [facility]. Requesting Provider: Rocky Massy, MD. Reason for transfer: DKA. Facility course:   78 year old male with past medical history hypertension, hyperlipidemia who presented to [facility] with left-sided abdominal pain and left-sided CVA tenderness.  Patient found to be in DKA.  Lab work: Urinalysis, Routine w reflex microscopic -Urine, Clean Catch [[id number]] (Abnormal)   Collected: 08/26/23 1120   Updated: 08/26/23 1145   Specimen Source: Urine, Clean Catch    Color, Urine YELLOW   APPearance CLEAR   Specific Gravity, Urine 1.029   pH 5.0   Glucose, UA >1,000 Abnormal  mg/dL   Hgb urine dipstick SMALL Abnormal    Bilirubin Urine NEGATIVE   Ketones, ur 40 Abnormal  mg/dL   Protein, ur NEGATIVE mg/dL   Nitrite NEGATIVE   Leukocytes,Ua NEGATIVE   RBC / HPF 0-5 RBC/hpf   WBC, UA 0-5 WBC/hpf   Bacteria, UA NONE SEEN   Squamous Epithelial / HPF 0-5 /HPF  I-Stat venous blood gas, ([facility], [facility], [facility]) [[id number]] (Abnormal)   Collected: 08/26/23 1135   Updated: 08/26/23 1136   Specimen Type: Blood    pH, Ven 7.327   pCO2, Ven 29.3 Low  mmHg   pO2, Ven 24 Low Panic  mmHg   Bicarbonate 15.5 Low  mmol/L   TCO2 16 Low  mmol/L   O2 Saturation 42 %   Acid-base deficit 9.0 High  mmol/L   Sodium 135 mmol/L   Potassium 5.0 mmol/L   Calcium , Ion 1.33 mmol/L   HCT 50.0 %   Hemoglobin 17.0 g/dL   Patient temperature 02.4 F   Collection site IV start   Drawn by RT   Sample type VENOUS   Comment NOTIFIED PHYSICIAN  Beta-hydroxybutyric acid [[id number]]   Collected: 08/26/23 1121   Updated: 08/26/23 1135   Specimen Type: Blood   Basic metabolic panel [[id number]]   Collected: 08/26/23 1121   Updated: 08/26/23 1135   Specimen Type: Blood   Lactic acid, plasma [[id number]]   Collected: 08/26/23 1121   Updated: 08/26/23 1134    Specimen Type: Blood   Salicylate level [[id number]]   Collected: 08/26/23 1121   Updated: 08/26/23 1134   Specimen Type: Blood   Urine Culture [[id number]]   Collected: 08/26/23 1120   Updated: 08/26/23 1133   Specimen Source: Urine, Clean Catch   CBG monitoring, ED [[id number]] (Abnormal)   Collected: 08/26/23 1128   Updated: 08/26/23 1130   Specimen Type: Blood    Glucose-Capillary 379 High  mg/dL  Comprehensive metabolic panel [[id number]] (Abnormal)   Collected: 08/26/23 1017   Updated: 08/26/23 1103   Specimen Type: Blood   Specimen Source: Vein    Sodium 135 mmol/L   Potassium 5.5 High  mmol/L   Chloride 95 Low  mmol/L   CO2 13 Low  mmol/L   Glucose, Bld 437 High  mg/dL   BUN 46 High  mg/dL   Creatinine, Ser 7.72 High  mg/dL   Calcium  11.0 High  mg/dL   Total Protein 7.7 g/dL   Albumin 4.2 g/dL   AST 15 U/L   ALT 14 U/L   Alkaline Phosphatase 88 U/L   Total Bilirubin 0.6 mg/dL   GFR, Estimated 29 Low  mL/min   Anion gap 27 High   CBC with Differential [[id number]] (Abnormal)   Collected: 08/26/23 1017   Updated: 08/26/23 1024   Specimen  Type: Blood   Specimen Source: Vein    WBC 17.8 High  K/uL   RBC 5.01 MIL/uL   Hemoglobin 16.3 g/dL   HCT 53.0 %   MCV 06.3 fL   MCH 32.5 pg   MCHC 34.8 g/dL   RDW 88.0 %   Platelets 313 K/uL   nRBC 0.0 %   Neutrophils Relative % 88 %   Neutro Abs 15.7 High  K/uL   Lymphocytes Relative 6 %   Lymphs Abs 1.1 K/uL   Monocytes Relative 5 %   Monocytes Absolute 0.8 K/uL   Eosinophils Relative 0 %   Eosinophils Absolute 0.0 K/uL   Basophils Relative 0 %   Basophils Absolute 0.0 K/uL   Immature Granulocytes 1 %   Abs Immature Granulocytes 0.08 High  K/uL   Imaging: IMPRESSION: 1. New moderate dilatation of the left renal pelvis and calyces with abrupt transition at the ureteropelvic junction and perinephric soft tissue stranding, suspicious for acute UPJ obstruction. A specific cause is not demonstrated by this  noncontrast study. Differential considerations include recently passed calculus, blood clot and tumor (transitional cell carcinoma). Urology follow-up recommended. 2. Punctate nonobstructing right renal calculus. No other urinary tract calculi. 3. No other acute abdominopelvic findings. 4.  Aortic Atherosclerosis (ICD10-I70.0).   Plan of care: The patient is accepted for admission to [facility] unit, at [facility] to continue IV fluids for AKI, DKA therapy and urology consultation for left hydronephrosis.  Author: Alm Dorn Castor, MD 08/26/2023  Check [email or web address] for on-call coverage.  Nursing staff, Please call [facility] Admits & Consults System-Wide number on Amion as soon as patient's arrival, so appropriate admitting provider can evaluate the pt.

## 2023-08-26 NOTE — ED Triage Notes (Signed)
 Pt caox4 from UC c/o L lower back pain worsening since July. Denies any traumatic injury. Denies numbness/tingling in extremities.

## 2023-08-26 NOTE — ED Notes (Signed)
 [facility] in ED preparing pt for transfer

## 2023-08-27 ENCOUNTER — Encounter (HOSPITAL_COMMUNITY): Payer: Self-pay

## 2023-08-27 ENCOUNTER — Observation Stay (HOSPITAL_COMMUNITY)

## 2023-08-27 DIAGNOSIS — R202 Paresthesia of skin: Secondary | ICD-10-CM | POA: Diagnosis not present

## 2023-08-27 DIAGNOSIS — E785 Hyperlipidemia, unspecified: Secondary | ICD-10-CM | POA: Diagnosis not present

## 2023-08-27 DIAGNOSIS — E875 Hyperkalemia: Secondary | ICD-10-CM | POA: Diagnosis not present

## 2023-08-27 DIAGNOSIS — E860 Dehydration: Secondary | ICD-10-CM | POA: Diagnosis not present

## 2023-08-27 DIAGNOSIS — E861 Hypovolemia: Secondary | ICD-10-CM | POA: Diagnosis not present

## 2023-08-27 DIAGNOSIS — E1110 Type 2 diabetes mellitus with ketoacidosis without coma: Secondary | ICD-10-CM | POA: Diagnosis not present

## 2023-08-27 DIAGNOSIS — N132 Hydronephrosis with renal and ureteral calculous obstruction: Secondary | ICD-10-CM | POA: Diagnosis not present

## 2023-08-27 DIAGNOSIS — K5900 Constipation, unspecified: Secondary | ICD-10-CM | POA: Diagnosis not present

## 2023-08-27 DIAGNOSIS — I10 Essential (primary) hypertension: Secondary | ICD-10-CM | POA: Diagnosis not present

## 2023-08-27 DIAGNOSIS — R739 Hyperglycemia, unspecified: Secondary | ICD-10-CM | POA: Diagnosis not present

## 2023-08-27 DIAGNOSIS — R7989 Other specified abnormal findings of blood chemistry: Secondary | ICD-10-CM | POA: Diagnosis not present

## 2023-08-27 DIAGNOSIS — H9190 Unspecified hearing loss, unspecified ear: Secondary | ICD-10-CM | POA: Diagnosis not present

## 2023-08-27 DIAGNOSIS — Z881 Allergy status to other antibiotic agents status: Secondary | ICD-10-CM | POA: Diagnosis not present

## 2023-08-27 DIAGNOSIS — Z79899 Other long term (current) drug therapy: Secondary | ICD-10-CM | POA: Diagnosis not present

## 2023-08-27 DIAGNOSIS — R944 Abnormal results of kidney function studies: Secondary | ICD-10-CM | POA: Diagnosis not present

## 2023-08-27 DIAGNOSIS — Z7985 Long-term (current) use of injectable non-insulin antidiabetic drugs: Secondary | ICD-10-CM | POA: Diagnosis not present

## 2023-08-27 DIAGNOSIS — R200 Anesthesia of skin: Secondary | ICD-10-CM | POA: Diagnosis not present

## 2023-08-27 DIAGNOSIS — N1330 Unspecified hydronephrosis: Secondary | ICD-10-CM | POA: Diagnosis not present

## 2023-08-27 DIAGNOSIS — D72829 Elevated white blood cell count, unspecified: Secondary | ICD-10-CM | POA: Diagnosis not present

## 2023-08-27 DIAGNOSIS — I700 Atherosclerosis of aorta: Secondary | ICD-10-CM | POA: Diagnosis not present

## 2023-08-27 DIAGNOSIS — Z7982 Long term (current) use of aspirin: Secondary | ICD-10-CM | POA: Diagnosis not present

## 2023-08-27 DIAGNOSIS — N179 Acute kidney failure, unspecified: Secondary | ICD-10-CM | POA: Diagnosis not present

## 2023-08-27 DIAGNOSIS — Z7984 Long term (current) use of oral hypoglycemic drugs: Secondary | ICD-10-CM | POA: Diagnosis not present

## 2023-08-27 HISTORY — PX: IR NEPHROSTOMY PLACEMENT LEFT: IMG6063

## 2023-08-27 LAB — COMPREHENSIVE METABOLIC PANEL WITH GFR
ALT: 13 U/L (ref 0–44)
AST: 20 U/L (ref 15–41)
Albumin: 2.8 g/dL — ABNORMAL LOW (ref 3.5–5.0)
Alkaline Phosphatase: 54 U/L (ref 38–126)
Anion gap: 9 (ref 5–15)
BUN: 31 mg/dL — ABNORMAL HIGH (ref 8–23)
CO2: 23 mmol/L (ref 22–32)
Calcium: 8.7 mg/dL — ABNORMAL LOW (ref 8.9–10.3)
Chloride: 106 mmol/L (ref 98–111)
Creatinine, Ser: 1.43 mg/dL — ABNORMAL HIGH (ref 0.61–1.24)
GFR, Estimated: 50 mL/min — ABNORMAL LOW (ref >60)
Glucose, Bld: 123 mg/dL — ABNORMAL HIGH (ref 70–99)
Potassium: 4 mmol/L (ref 3.5–5.1)
Sodium: 138 mmol/L (ref 135–145)
Total Bilirubin: 0.6 mg/dL (ref 0.0–1.2)
Total Protein: 5.8 g/dL — ABNORMAL LOW (ref 6.5–8.1)

## 2023-08-27 LAB — CBC
HCT: 41.1 % (ref 39.0–52.0)
Hemoglobin: 13.7 g/dL (ref 13.0–17.0)
MCH: 32 pg (ref 26.0–34.0)
MCHC: 33.3 g/dL (ref 30.0–36.0)
MCV: 96 fL (ref 80.0–100.0)
Platelets: 229 K/uL (ref 150–400)
RBC: 4.28 MIL/uL (ref 4.22–5.81)
RDW: 12 % (ref 11.5–15.5)
WBC: 11.4 K/uL — ABNORMAL HIGH (ref 4.0–10.5)
nRBC: 0 % (ref 0.0–0.2)

## 2023-08-27 LAB — URINE CULTURE: Culture: NO GROWTH

## 2023-08-27 LAB — GLUCOSE, CAPILLARY
Glucose-Capillary: 110 mg/dL — ABNORMAL HIGH (ref 70–99)
Glucose-Capillary: 112 mg/dL — ABNORMAL HIGH (ref 70–99)
Glucose-Capillary: 114 mg/dL — ABNORMAL HIGH (ref 70–99)
Glucose-Capillary: 116 mg/dL — ABNORMAL HIGH (ref 70–99)
Glucose-Capillary: 117 mg/dL — ABNORMAL HIGH (ref 70–99)
Glucose-Capillary: 136 mg/dL — ABNORMAL HIGH (ref 70–99)
Glucose-Capillary: 181 mg/dL — ABNORMAL HIGH (ref 70–99)
Glucose-Capillary: 189 mg/dL — ABNORMAL HIGH (ref 70–99)
Glucose-Capillary: 78 mg/dL (ref 70–99)
Glucose-Capillary: 84 mg/dL (ref 70–99)

## 2023-08-27 LAB — BASIC METABOLIC PANEL WITH GFR
Anion gap: 10 (ref 5–15)
BUN: 35 mg/dL — ABNORMAL HIGH (ref 8–23)
CO2: 21 mmol/L — ABNORMAL LOW (ref 22–32)
Calcium: 9 mg/dL (ref 8.9–10.3)
Chloride: 108 mmol/L (ref 98–111)
Creatinine, Ser: 1.66 mg/dL — ABNORMAL HIGH (ref 0.61–1.24)
GFR, Estimated: 42 mL/min — ABNORMAL LOW (ref >60)
Glucose, Bld: 129 mg/dL — ABNORMAL HIGH (ref 70–99)
Potassium: 4 mmol/L (ref 3.5–5.1)
Sodium: 139 mmol/L (ref 135–145)

## 2023-08-27 MED ORDER — AMLODIPINE BESYLATE 5 MG PO TABS
5.0000 mg | ORAL_TABLET | Freq: Every day | ORAL | Status: DC
  Filled 2023-08-27: qty 1

## 2023-08-27 MED ORDER — AMLODIPINE BESYLATE 5 MG PO TABS: 5.0000 mg | ORAL_TABLET | Freq: Every day | ORAL | Status: DC

## 2023-08-27 MED ORDER — PANTOPRAZOLE SODIUM 40 MG PO TBEC
40.0000 mg | DELAYED_RELEASE_TABLET | Freq: Two times a day (BID) | ORAL | Status: DC
  Administered 2023-08-27 – 2023-08-29 (×4): 40 mg via ORAL
  Filled 2023-08-27 (×4): qty 1

## 2023-08-27 MED ORDER — ATORVASTATIN CALCIUM 40 MG PO TABS
40.0000 mg | ORAL_TABLET | Freq: Every day | ORAL | Status: DC
  Administered 2023-08-27 – 2023-08-29 (×3): 40 mg via ORAL
  Filled 2023-08-27 (×3): qty 1

## 2023-08-27 MED ORDER — LACTATED RINGERS IV SOLN: INTRAVENOUS | Status: AC

## 2023-08-27 MED ORDER — FENTANYL CITRATE (PF) 100 MCG/2ML IJ SOLN
INTRAMUSCULAR | Status: AC
  Filled 2023-08-27: qty 2

## 2023-08-27 MED ORDER — DIPHENHYDRAMINE HCL 50 MG/ML IJ SOLN
INTRAMUSCULAR | Status: AC
  Filled 2023-08-27: qty 1

## 2023-08-27 MED ORDER — FENTANYL CITRATE (PF) 100 MCG/2ML IJ SOLN
INTRAMUSCULAR | Status: AC | PRN
  Administered 2023-08-27: 50 ug via INTRAVENOUS

## 2023-08-27 MED ORDER — LIDOCAINE HCL 1 % IJ SOLN
INTRAMUSCULAR | Status: AC
  Filled 2023-08-27: qty 20

## 2023-08-27 MED ORDER — LIDOCAINE-EPINEPHRINE 1 %-1:100000 IJ SOLN
INTRAMUSCULAR | Status: AC
  Filled 2023-08-27: qty 1

## 2023-08-27 MED ORDER — LIDOCAINE HCL 1 % IJ SOLN
20.0000 mL | Freq: Once | INTRAMUSCULAR | Status: AC
  Administered 2023-08-27: 10 mL via INTRADERMAL

## 2023-08-27 MED ORDER — DIPHENHYDRAMINE HCL 50 MG/ML IJ SOLN
INTRAMUSCULAR | Status: AC | PRN
  Administered 2023-08-27: 50 mg via INTRAVENOUS

## 2023-08-27 MED ORDER — SODIUM CHLORIDE 0.9% FLUSH
5.0000 mL | Freq: Three times a day (TID) | INTRAVENOUS | Status: DC
  Administered 2023-08-27 – 2023-08-29 (×5): 5 mL

## 2023-08-27 MED ORDER — SODIUM CHLORIDE 0.9 % IV SOLN
INTRAVENOUS | Status: AC
  Filled 2023-08-27: qty 20

## 2023-08-27 MED ORDER — MIDAZOLAM HCL 2 MG/2ML IJ SOLN
INTRAMUSCULAR | Status: AC | PRN
  Administered 2023-08-27: 1 mg via INTRAVENOUS

## 2023-08-27 MED ORDER — PANTOPRAZOLE SODIUM 40 MG IV SOLR: 40.0000 mg | INTRAVENOUS | Status: DC

## 2023-08-27 MED ORDER — INSULIN ASPART 100 UNIT/ML IJ SOLN
0.0000 [IU] | INTRAMUSCULAR | Status: DC
  Administered 2023-08-27 – 2023-08-28 (×3): 1 [IU] via SUBCUTANEOUS

## 2023-08-27 MED ORDER — PANTOPRAZOLE SODIUM 40 MG IV SOLR
40.0000 mg | Freq: Two times a day (BID) | INTRAVENOUS | Status: DC
  Administered 2023-08-27: 40 mg via INTRAVENOUS
  Filled 2023-08-27: qty 10

## 2023-08-27 MED ORDER — HYDRALAZINE HCL 20 MG/ML IJ SOLN
5.0000 mg | Freq: Four times a day (QID) | INTRAMUSCULAR | Status: DC | PRN
  Administered 2023-08-28: 5 mg via INTRAVENOUS
  Filled 2023-08-27: qty 1

## 2023-08-27 MED ORDER — INSULIN GLARGINE-YFGN 100 UNIT/ML ~~LOC~~ SOLN
5.0000 [IU] | SUBCUTANEOUS | Status: DC
  Administered 2023-08-27 (×2): 5 [IU] via SUBCUTANEOUS
  Filled 2023-08-27 (×3): qty 0.05

## 2023-08-27 MED ORDER — IOHEXOL 300 MG/ML  SOLN
50.0000 mL | Freq: Once | INTRAMUSCULAR | Status: AC | PRN
  Administered 2023-08-27: 10 mL

## 2023-08-27 MED ORDER — MIDAZOLAM HCL 2 MG/2ML IJ SOLN
INTRAMUSCULAR | Status: AC
  Filled 2023-08-27: qty 2

## 2023-08-27 MED ORDER — BACLOFEN 10 MG PO TABS
5.0000 mg | ORAL_TABLET | Freq: Three times a day (TID) | ORAL | Status: DC | PRN
  Administered 2023-08-27 (×2): 5 mg via ORAL
  Filled 2023-08-27 (×2): qty 1

## 2023-08-27 NOTE — Inpatient Diabetes Management (Addendum)
 Inpatient Diabetes Program Recommendations  AACE/ADA: New Consensus Statement on Inpatient Glycemic Control (2015)  Target Ranges:  Prepandial:   less than 140 mg/dL      Peak postprandial:   less than 180 mg/dL (1-2 hours)      Critically ill patients:  140 - 180 mg/dL    Latest Reference Range & Units 08/26/23 11:28 08/26/23 11:47 08/26/23 12:53 08/26/23 14:08 08/26/23 15:04 08/26/23 16:07 08/26/23 17:08 08/26/23 18:24 08/26/23 19:43 08/26/23 20:47  Glucose-Capillary 70 - 99 mg/dL 620 (H) 674 (H)  IV Insulin  Drip Started  258 (H) 188 (H) 175 (H) 141 (H) 156 (H) 143 (H) 99 104 (H)  (H): Data is abnormally high  Latest Reference Range & Units 08/26/23 21:58 08/26/23 23:04 08/27/23 00:06 08/27/23 01:06 08/27/23 02:06 08/27/23 03:01 08/27/23 04:02  Glucose-Capillary 70 - 99 mg/dL 871 (H) 886 (H) 887 (H) 116 (H)  5 units Semglee  @0127  110 (H) 136 (H) 117 (H)  IV Insulin  Drip Stopped  (H): Data is abnormally high  Latest Reference Range & Units 08/27/23 08:04  Glucose-Capillary 70 - 99 mg/dL 885 (H)  (H): Data is abnormally high   To [facility] ED with Back Pain--Found to be in DKA--L UPJ obstruction    History: DM  Home DM Meds: Jardiance 25 mg daily      Amaryl 2 mg daily      Ozempic 2 mg Qweek    Current Orders: IV Insulin  Drip    Per MD notes, recently prescribed insulin  but has not started it yet A1c on 08/25/2023 at PCP appt was 8.9% PCP: Dr. Charlott with [facility] at [facility] Given Rx for Prednisone taper on 08/25/2023 for Back Pain Given Rx to Start Tresiba 10 units daily on 08/06--Given CGM as well Transitioned to SQ Insulin  early this AM   MD- Wife told me they saw PCP y[facility] and PCP told them to Stop the Jardiance, the Ozempic, and the Amaryl.  Sent Rx to pharmacy to have pt start Tresiba insulin  10 units daily for now and PCP office started pt on Dexcom G7 CGM.      Addendum 12pm--Met w/ pt and wife this AM.  Wife very involved in care.  Wife told me they saw PCP yest  and PCP told them to Stop the Jardiance, the Ozempic, and the Amaryl.  Sent Rx to pharmacy to have pt start Tresiba insulin  10 units daily for now and PCP office started pt on Dexcom G7 CGM.  Wife hasn't picked up Rx yet.  Pt was given a transmitter for the Dexcom b/c he couldn't remember his apple id password.  Wife had the [facility] transmitter in her bag.  I showed pt and wife how to use the transmitter and reviewed how the Dexcom differs from the traditional fingerstick.  I verbally reviewed with pt and wife how to apply new sensor in 9 days.  Gave pt and wife 1 extra sample sensor to take home.  Also reviewed with pt and wife how to use insulin  pen.  Pt has been using Ozempic pens at home which is very similar.  Pt and wife stated understanding and wife felt very confident that they will be able to use the insulin  pens without difficulty.  I did review with pt and wife the importance of consistency of timing with basal insulin .      --Will follow patient during hospitalization--  Adina Rudolpho Arrow RN, MSN, CDCES Diabetes Coordinator Inpatient Glycemic Control Team Team Pager: [phone] (8a-5p)

## 2023-08-27 NOTE — Procedures (Signed)
 Pre procedural Dx: left sided hydronephrosis Post procedural Dx: Same  10Fr percutaneous nephrostomy tube placed with U/S and fluoroscopy   EBL: Trace Complications: None immediate  KANDICE Banner, MD Pager #: [phone]

## 2023-08-27 NOTE — Progress Notes (Signed)
 PROGRESS NOTE    Luis Shaw  FMW:[id number] DOB: 1945-02-23 DOA: 08/26/2023 PCP: Charlott Dorn LABOR, MD   Brief Narrative: 78 year old with past medical history significant for diabetes type 2, hyperlipidemia, hypertension presented to the ED complaining of left-sided abdominal pain and flank pain for the last 3 weeks getting progressively worse.  He also complained of numbness and tingling in his lower extremities.  Incidentally he was found to be in DKA.  Evaluation ED pH 7.3, bicarbonate 15, CO2 16, glucose 437, BUN 46, creatinine 2.2 lactic acid 2.7, beta hydroxybutyric acid 4.8.  CT renal stone protocol showed new moderate dilation of the left renal pelvis and calyces with transition at the ureteropelvic junction and perinephric soft tissue stranding suspicious for acute UPJ obstruction.  Urology was consulted and recommended IR consultation for percutaneous nephrostomy tube placement.  Patient will also require diagnostic ureteroscopy in the coming weeks per urology.  Assessment & Plan:   Principal Problem:   DKA (diabetic ketoacidosis) (HCC) Active Problems:   Hyperkalemia   Dehydration   Hypercalcemia   Elevated serum creatinine   Leukocytosis   Obstruction of left ureteropelvic junction (UPJ)   1-DKA, diabetes type 2 Lactic acidosis -Patient presents with bicarb of 13, glucose 437, beta butyrate acid 4 - He was started on IV fluids and insulin  drip -transition to Semglee  and SSI Start Carb modified diet after procedure.   Obstructive left ureteropelvic junction AKI -Patient presented with a creatinine of 2.2, suspect related to hypovolemia, obstructive uropathy. - Creatinine down to 1.4 - Follow urine culture - Urology consulted and recommended IR consultation for percutaneous nephrostomy tube placement.  Patient will acquire diagnostic ureteroscopy in the coming several weeks  Hyperkalemia -In the setting of DKA and AKI.  Resolved with  fluids  Hypercalcemia -In the setting of hypovolemia dehydration  Hypertension: Will resume Norvasc .  Will hold lisinopril  Leukocytosis -Trending down - Follow urine culture  Hyperlipidemia: Resume statin  Estimated body mass index is 25.74 kg/m as calculated from the following:   Height as of this encounter: 5' 8 (1.727 m).   Weight as of this encounter: 76.8 kg.   DVT prophylaxis: SCD Code Status: Full code Family Communication: wife who was at bedside.  Disposition Plan:  Status is: Observation The patient will require care spanning > 2 midnights and should be moved to inpatient because: management of DKA, and AKI    Consultants:  Urology IR  Procedures:  Percutaneous nephrostomy tube placement.   Antimicrobials:    Subjective: He is alert, conversant, answer questions had some pain earlier.   Objective: Vitals:   08/27/23 0100 08/27/23 0200 08/27/23 0300 08/27/23 0500  BP: 137/69 (!) 142/78 138/84 (!) 165/82  Pulse: 82 73 82 77  Resp: 18 15 15 13   Temp:      TempSrc:      SpO2: 91% 93% 94% 96%  Weight:      Height:        Intake/Output Summary (Last 24 hours) at 08/27/2023 0659 Last data filed at 08/27/2023 0600 Gross per 24 hour  Intake 4238.01 ml  Output 1110 ml  Net 3128.01 ml   Filed Weights   08/26/23 1123 08/26/23 1405  Weight: 76.7 kg 76.8 kg    Examination:  General exam: Appears calm and comfortable  Respiratory system: Clear to auscultation. Respiratory effort normal. Cardiovascular system: S1 & S2 heard, RRR. No JVD, murmurs, rubs, gallops or clicks. No pedal edema. Gastrointestinal system: Abdomen is nondistended, soft and nontender. No  organomegaly or masses felt. Normal bowel sounds heard. Central nervous system: Alert and oriented.  Extremities: Symmetric 5 x 5 power.   Data Reviewed: I have personally reviewed following labs and imaging studies  CBC: Recent Labs  Lab 08/26/23 1017 08/26/23 1135 08/27/23 0358  WBC  17.8*  --  11.4*  NEUTROABS 15.7*  --   --   HGB 16.3 17.0 13.7  HCT 46.9 50.0 41.1  MCV 93.6  --  96.0  PLT 313  --  229   Basic Metabolic Panel: Recent Labs  Lab 08/26/23 1017 08/26/23 1121 08/26/23 1135 08/26/23 2029 08/27/23 0041 08/27/23 0358  NA 135 136 135 139 139 138  K 5.5* 5.2* 5.0 4.1 4.0 4.0  CL 95* 97*  --  110 108 106  CO2 13* 15*  --  17* 21* 23  GLUCOSE 437* 393*  --  102* 129* 123*  BUN 46* 44*  --  37* 35* 31*  CREATININE 2.27* 2.15*  --  1.47* 1.66* 1.43*  CALCIUM  11.0* 11.4*  --  9.0 9.0 8.7*   GFR: Estimated Creatinine Clearance: 40.5 mL/min (A) (by C-G formula based on SCr of 1.43 mg/dL (H)). Liver Function Tests: Recent Labs  Lab 08/26/23 1017 08/27/23 0358  AST 15 20  ALT 14 13  ALKPHOS 88 54  BILITOT 0.6 0.6  PROT 7.7 5.8*  ALBUMIN 4.2 2.8*   No results for input(s): LIPASE, AMYLASE in the last 168 hours. No results for input(s): AMMONIA in the last 168 hours. Coagulation Profile: Recent Labs  Lab 08/26/23 1510  INR 1.1   Cardiac Enzymes: No results for input(s): CKTOTAL, CKMB, CKMBINDEX, TROPONINI in the last 168 hours. BNP (last 3 results) No results for input(s): PROBNP in the last 8760 hours. HbA1C: No results for input(s): HGBA1C in the last 72 hours. CBG: Recent Labs  Lab 08/27/23 0006 08/27/23 0106 08/27/23 0206 08/27/23 0301 08/27/23 0402  GLUCAP 112* 116* 110* 136* 117*   Lipid Profile: No results for input(s): CHOL, HDL, LDLCALC, TRIG, CHOLHDL, LDLDIRECT in the last 72 hours. Thyroid  Function Tests: No results for input(s): TSH, T4TOTAL, FREET4, T3FREE, THYROIDAB in the last 72 hours. Anemia Panel: No results for input(s): VITAMINB12, FOLATE, FERRITIN, TIBC, IRON, RETICCTPCT in the last 72 hours. Sepsis Labs: Recent Labs  Lab 08/26/23 1121 08/26/23 1320 08/26/23 1510 08/26/23 1801  LATICACIDVEN 2.7* 3.5* 3.1* 1.4    Recent Results (from the past 240  hours)  MRSA Next Gen by PCR, Nasal     Status: None   Collection Time: 08/26/23  2:01 PM   Specimen: Nasal Mucosa; Nasal Swab  Result Value Ref Range Status   MRSA by PCR Next Gen NOT DETECTED NOT DETECTED Final    Comment: (NOTE) The GeneXpert MRSA Assay (FDA approved for NASAL specimens only), is one component of a comprehensive MRSA colonization surveillance program. It is not intended to diagnose MRSA infection nor to guide or monitor treatment for MRSA infections. Test performance is not FDA approved in patients less than 12 years old. Performed at [facility], [address] [place], [place] [address]          Radiology Studies: [facility] Chest Port 1 View Result Date: 08/26/2023 CLINICAL DATA:  Acute dyspnea. EXAM: PORTABLE CHEST 1 VIEW COMPARISON:  December 11, 2019. FINDINGS: The heart size and mediastinal contours are within normal limits. Minimal scarring is noted in right upper lobe. No acute pulmonary disease is noted. The visualized skeletal structures are unremarkable. IMPRESSION:  No active disease. Electronically Signed   By: Lynwood Landy Raddle M.D.   On: 08/26/2023 14:44   CT Renal Stone Study Result Date: 08/26/2023 CLINICAL DATA:  Abdominal/flank pain, stone suspected Left lower back pain worsening since July. No known injury. History of diabetes and appendectomy. EXAM: CT ABDOMEN AND PELVIS WITHOUT CONTRAST TECHNIQUE: Multidetector CT imaging of the abdomen and pelvis was performed following the standard protocol without IV contrast. RADIATION DOSE REDUCTION: This exam was performed according to the departmental dose-optimization program which includes automated exposure control, adjustment of the mA and/or kV according to patient size and/or use of iterative reconstruction technique. COMPARISON:  Abdominopelvic CT 03/25/2011.  Chest CT 02/01/2020. FINDINGS: Lower chest: Aortic and coronary artery atherosclerosis noted. The lung bases are clear. There is a  small hiatal hernia. Hepatobiliary: The liver has a non cirrhotic morphology. No suspicious focal abnormalities are identified on noncontrast imaging. No evidence of gallstones, gallbladder wall thickening or biliary dilatation. Pancreas: Unremarkable. No pancreatic ductal dilatation or surrounding inflammatory changes. Spleen: Normal in size without focal abnormality. Adrenals/Urinary Tract: Both adrenal glands appear normal. There is a punctate nonobstructing calculus in the interpolar region of the right kidney. No other urinary tract calculi are demonstrated. New moderate dilatation of the left renal pelvis and calyces with abrupt transition at the ureteropelvic junction. Associated mild asymmetric left perinephric soft tissue stranding, suggesting a degree of acute collecting system obstruction. No right-sided hydronephrosis. There are simple and mildly complex renal cysts bilaterally for which no specific follow-up imaging is recommended. The bladder appears unremarkable for its degree of distention. Stomach/Bowel: No enteric contrast administered. The stomach appears unremarkable for its degree of distension. No evidence of bowel wall thickening, distention or surrounding inflammatory change. Mildly prominent stool in the proximal colon. Vascular/Lymphatic: There are no enlarged abdominal or pelvic lymph nodes. Aortic and branch vessel atherosclerosis without evidence of aneurysm. Retroaortic left renal vein noted. Reproductive: Mild enlargement of the prostate gland. Other: Stable mildly prominent fat in both inguinal canals. Small umbilical hernia containing only fat. No ascites or pneumoperitoneum. Musculoskeletal: No acute or significant osseous findings. Stable benign appearing sclerotic lesion in the right superior pubic ramus, likely a bone island. IMPRESSION: 1. New moderate dilatation of the left renal pelvis and calyces with abrupt transition at the ureteropelvic junction and perinephric soft tissue  stranding, suspicious for acute UPJ obstruction. A specific cause is not demonstrated by this noncontrast study. Differential considerations include recently passed calculus, blood clot and tumor (transitional cell carcinoma). Urology follow-up recommended. 2. Punctate nonobstructing right renal calculus. No other urinary tract calculi. 3. No other acute abdominopelvic findings. 4.  Aortic Atherosclerosis (ICD10-I70.0). Electronically Signed   By: Elsie Perone M.D.   On: 08/26/2023 11:25        Scheduled Meds:  Chlorhexidine  Gluconate Cloth  6 each Topical Daily   insulin  aspart  0-6 Units Subcutaneous Q4H   insulin  glargine-yfgn  5 Units Subcutaneous Q24H   Continuous Infusions:  cefTRIAXone  (ROCEPHIN )  IV     lactated ringers  75 mL/hr at 08/27/23 0600     LOS: 0 days    Time spent: 35 minutes    Deklan Minar A Dalesha Stanback, MD [facility]   If 7PM-7AM, please contact night-coverage [email or web address]  08/27/2023, 6:59 AM

## 2023-08-27 NOTE — Sedation Documentation (Signed)
 RN Jonquil Stubbe pulled 2 mg Versed  and 100 mcg Fentanyl  in Ir room. Pt. Received 1 mg Versed  and 50 mcg Fentanyl  throughout the procedure. RN Larah Kuntzman wasted  1 mg Versed  and  50 mcg Fentanyl  with Nena Gerline PEAK.

## 2023-08-28 ENCOUNTER — Encounter (HOSPITAL_COMMUNITY): Payer: Self-pay

## 2023-08-28 DIAGNOSIS — E1110 Type 2 diabetes mellitus with ketoacidosis without coma: Secondary | ICD-10-CM | POA: Diagnosis not present

## 2023-08-28 LAB — BASIC METABOLIC PANEL WITH GFR
Anion gap: 17 — ABNORMAL HIGH (ref 5–15)
BUN: 25 mg/dL — ABNORMAL HIGH (ref 8–23)
CO2: 19 mmol/L — ABNORMAL LOW (ref 22–32)
Calcium: 8.9 mg/dL (ref 8.9–10.3)
Chloride: 99 mmol/L (ref 98–111)
Creatinine, Ser: 1.35 mg/dL — ABNORMAL HIGH (ref 0.61–1.24)
GFR, Estimated: 53 mL/min — ABNORMAL LOW (ref >60)
Glucose, Bld: 172 mg/dL — ABNORMAL HIGH (ref 70–99)
Potassium: 4.2 mmol/L (ref 3.5–5.1)
Sodium: 135 mmol/L (ref 135–145)

## 2023-08-28 LAB — GLUCOSE, CAPILLARY
Glucose-Capillary: 125 mg/dL — ABNORMAL HIGH (ref 70–99)
Glucose-Capillary: 118 mg/dL — ABNORMAL HIGH (ref 70–99)
Glucose-Capillary: 190 mg/dL — ABNORMAL HIGH (ref 70–99)
Glucose-Capillary: 145 mg/dL — ABNORMAL HIGH (ref 70–99)
Glucose-Capillary: 206 mg/dL — ABNORMAL HIGH (ref 70–99)
Glucose-Capillary: 119 mg/dL — ABNORMAL HIGH (ref 70–99)

## 2023-08-28 LAB — CBC
HCT: 49.7 % (ref 39.0–52.0)
Hemoglobin: 16.7 g/dL (ref 13.0–17.0)
MCH: 32.6 pg (ref 26.0–34.0)
MCHC: 33.6 g/dL (ref 30.0–36.0)
MCV: 97.1 fL (ref 80.0–100.0)
Platelets: 257 K/uL (ref 150–400)
RBC: 5.12 MIL/uL (ref 4.22–5.81)
RDW: 11.8 % (ref 11.5–15.5)
WBC: 10.2 K/uL (ref 4.0–10.5)
nRBC: 0 % (ref 0.0–0.2)

## 2023-08-28 MED ORDER — INSULIN GLARGINE-YFGN 100 UNIT/ML ~~LOC~~ SOLN
10.0000 [IU] | SUBCUTANEOUS | Status: DC
  Filled 2023-08-28: qty 0.1

## 2023-08-28 MED ORDER — POLYETHYLENE GLYCOL 3350 17 G PO PACK
17.0000 g | PACK | Freq: Every day | ORAL | Status: DC
  Administered 2023-08-28 – 2023-08-29 (×2): 17 g via ORAL
  Filled 2023-08-28 (×2): qty 1

## 2023-08-28 MED ORDER — INSULIN GLARGINE-YFGN 100 UNIT/ML ~~LOC~~ SOLN
10.0000 [IU] | Freq: Every day | SUBCUTANEOUS | Status: DC
  Administered 2023-08-28 – 2023-08-29 (×2): 10 [IU] via SUBCUTANEOUS
  Filled 2023-08-28 (×3): qty 0.1

## 2023-08-28 MED ORDER — INSULIN ASPART 100 UNIT/ML IJ SOLN
0.0000 [IU] | Freq: Three times a day (TID) | INTRAMUSCULAR | Status: DC
  Administered 2023-08-28 – 2023-08-29 (×3): 2 [IU] via SUBCUTANEOUS

## 2023-08-28 MED ORDER — SODIUM CHLORIDE 0.9 % IV BOLUS
500.0000 mL | Freq: Once | INTRAVENOUS | Status: AC
  Administered 2023-08-28: 500 mL via INTRAVENOUS

## 2023-08-28 MED ORDER — AMLODIPINE BESYLATE 5 MG PO TABS
5.0000 mg | ORAL_TABLET | Freq: Every day | ORAL | Status: DC
  Administered 2023-08-28 – 2023-08-29 (×2): 5 mg via ORAL
  Filled 2023-08-28 (×2): qty 1

## 2023-08-28 MED ORDER — LACTATED RINGERS IV SOLN: INTRAVENOUS | Status: DC

## 2023-08-28 MED ORDER — INSULIN ASPART 100 UNIT/ML IJ SOLN: 0.0000 [IU] | Freq: Every day | INTRAMUSCULAR | Status: DC

## 2023-08-28 NOTE — Evaluation (Addendum)
 Physical Therapy Evaluation Patient Details Name: Luis Shaw MRN: [id number] DOB: Nov 16, 1945 Today's Date: 08/28/2023  History of Present Illness  78 y.o. male with pt presented to [facility] emergency room for persistent abdominal/left flank pain and DKA. He was transferred to [facility] for higher level of care.  CT abdomen pelvis showed New moderate dilatation of the left renal pelvis and calyces with  abrupt transition at the ureteropelvic junction and perinephric soft  tissue stranding, suspicious for acute UPJ obstruction. underwent percutaneous nephrostomy tube placement on 8/9.   past medical history significant for diabetes, hyperlipidemia, hypertension.  Clinical Impression  Patient evaluated by Physical Therapy with no further acute PT needs identified. All education has been completed and the patient has no further questions.  Pt with no further needs at this time. Amb 250' with no assistance or device needed. Encouraged pt to continue to mobilize with spouse as tolerated. No further PT needs, no f/u needs at this time   See below for any follow-up Physical Therapy or equipment needs. PT is signing off. Thank you for this referral.         If plan is discharge home, recommend the following:     Can travel by private vehicle        Equipment Recommendations None recommended by PT  Recommendations for Other Services       Functional Status Assessment Patient has not had a recent decline in their functional status     Precautions / Restrictions Precautions Precautions: None Restrictions Weight Bearing Restrictions Per Provider Order: No   Nephrostomy tube placement    Mobility  Bed Mobility Overal bed mobility: Independent                  Transfers Overall transfer level: Independent                      Ambulation/Gait Ambulation/Gait assistance: Modified independent (Device/Increase time) Gait Distance (Feet): 250 Feet Assistive device:  None Gait Pattern/deviations: Step-through pattern, WFL(Within Functional Limits)       General Gait Details: no LOB, pt feels he is returning to baseline  Stairs            Wheelchair Mobility     Tilt Bed    Modified Rankin (Stroke Patients Only)       Balance Overall balance assessment: No apparent balance deficits (not formally assessed)                                           Pertinent Vitals/Pain Pain Assessment Pain Assessment: No/denies pain    Home Living Family/patient expects to be discharged to:: Private residence Living Arrangements: Spouse/significant other Available Help at Discharge: Family Type of Home: House Home Access: Stairs to enter   Secretary/administrator of Steps: 3   Home Layout: One level Home Equipment: None      Prior Function Prior Level of Function : Independent/Modified Independent                     Extremity/Trunk Assessment   Upper Extremity Assessment Upper Extremity Assessment: Overall WFL for tasks assessed    Lower Extremity Assessment Lower Extremity Assessment: Overall WFL for tasks assessed    Cervical / Trunk Assessment Cervical / Trunk Assessment: Normal  Communication   Communication Communication: No apparent difficulties    Cognition Arousal: Alert  Behavior During Therapy: WFL for tasks assessed/performed   PT - Cognitive impairments: No apparent impairments                         Following commands: Intact       Cueing Cueing Techniques: Verbal cues     General Comments      Exercises     Assessment/Plan    PT Assessment Patient does not need any further PT services  PT Problem List         PT Treatment Interventions      PT Goals (Current goals can be found in the Care Plan section)  Acute Rehab PT Goals PT Goal Formulation: All assessment and education complete, DC therapy    Frequency       Co-evaluation                AM-PAC PT 6 Clicks Mobility  Outcome Measure Help needed turning from your back to your side while in a flat bed without using bedrails?: None Help needed moving from lying on your back to sitting on the side of a flat bed without using bedrails?: None Help needed moving to and from a bed to a chair (including a wheelchair)?: None Help needed standing up from a chair using your arms (e.g., wheelchair or bedside chair)?: None Help needed to walk in hospital room?: None Help needed climbing 3-5 steps with a railing? : None 6 Click Score: 24    End of Session   Activity Tolerance: Patient tolerated treatment well Patient left: in chair;with call bell/phone within reach;with family/visitor present Nurse Communication: Mobility status PT Visit Diagnosis: Other abnormalities of gait and mobility (R26.89)    Time: 1539-1550 PT Time Calculation (min) (ACUTE ONLY): 11 min   Charges:   PT Evaluation $PT Eval Low Complexity: 1 Low   PT General Charges $$ ACUTE PT VISIT: 1 Visit         Lexington Devine, PT  Acute Rehab Dept [facility]) [phone]  08/28/2023   [facility] 08/28/2023, 4:42 PM

## 2023-08-28 NOTE — Progress Notes (Signed)
 PROGRESS NOTE    LONN IM  FMW:[id number] DOB: [birth year] DOA: 08/26/2023 PCP: Charlott Dorn LABOR, MD   Brief Narrative: 78 year old with past medical history significant for diabetes type 2, hyperlipidemia, hypertension presented to the ED complaining of left-sided abdominal pain and flank pain for the last 3 weeks getting progressively worse.  He also complained of numbness and tingling in his lower extremities.  Incidentally he was found to be in DKA.  Evaluation ED pH 7.3, bicarbonate 15, CO2 16, glucose 437, BUN 46, creatinine 2.2 lactic acid 2.7, beta hydroxybutyric acid 4.8.  CT renal stone protocol showed new moderate dilation of the left renal pelvis and calyces with transition at the ureteropelvic junction and perinephric soft tissue stranding suspicious for acute UPJ obstruction.  Urology was consulted and recommended IR consultation for percutaneous nephrostomy tube placement.  Patient will also require diagnostic ureteroscopy in the coming weeks per urology.  Assessment & Plan:   Principal Problem:   DKA (diabetic ketoacidosis) (HCC) Active Problems:   Hyperkalemia   Dehydration   Hypercalcemia   Elevated serum creatinine   Leukocytosis   Obstruction of left ureteropelvic junction (UPJ)   1-DKA, diabetes type 2 Lactic acidosis -Patient presents with bicarb of 13, glucose 437, beta butyrate acid 4 - He was started on IV fluids and insulin  drip -transition to Semglee  and SSI Increase semglee  to 10 units  Obstructive left ureteropelvic junction AKI -Patient presented with a creatinine of 2.2, suspect related to hypovolemia, obstructive uropathy. - Creatinine down to 1.4 - Follow urine culture: No growth to date.  - Urology consulted and recommended IR consultation for percutaneous nephrostomy tube placement.  Patient will acquire diagnostic ureteroscopy in the coming several weeks -underwent left PCN by IR 8/08 Renal function improved.    Hyperkalemia -In the setting of DKA and AKI.  Resolved with fluids  Hypercalcemia -In the setting of hypovolemia dehydration  Hypertension: Will resume Norvasc .  Will hold lisinopril  Leukocytosis -Trending down - Follow urine culture  Hyperlipidemia: Resume statin Constipation: Miralax  daily   Estimated body mass index is 26.28 kg/m as calculated from the following:   Height as of this encounter: 5' 8 (1.727 m).   Weight as of this encounter: 78.4 kg.   DVT prophylaxis: SCD Code Status: Full code Family Communication: wife who was at bedside 8/08 Disposition Plan:  Status is: Observation The patient will require care spanning > 2 midnights and should be moved to inpatient because: management of DKA, and AKI    Consultants:  Urology IR  Procedures:  Percutaneous nephrostomy tube placement.   Antimicrobials:    Subjective: Doing well, didn't have a good night.  Trying to have a bowel movement. Report constipation.   Objective: Vitals:   08/28/23 0400 08/28/23 0505 08/28/23 0510 08/28/23 0605  BP: (!) 140/65  (!) 147/103 (!) 145/91  Pulse: 87 76 88 81  Resp: 12 11 16  (!) 7  Temp: 97.7 F (36.5 C)     TempSrc: Oral     SpO2: 95% 95% 95% 96%  Weight:      Height:        Intake/Output Summary (Last 24 hours) at 08/28/2023 0718 Last data filed at 08/28/2023 0542 Gross per 24 hour  Intake 1671.87 ml  Output 3360 ml  Net -1688.13 ml   Filed Weights   08/26/23 1123 08/26/23 1405 08/28/23 0200  Weight: 76.7 kg 76.8 kg 78.4 kg    Examination:  General exam: NAD Respiratory system: CTA Cardiovascular system: S  1, S 2 RRR Gastrointestinal system: BS present, soft, nt Central nervous system: Alert Extremities: Symmetric 5 x 5 power.   Data Reviewed: I have personally reviewed following labs and imaging studies  CBC: Recent Labs  Lab 08/26/23 1017 08/26/23 1135 08/27/23 0358  WBC 17.8*  --  11.4*  NEUTROABS 15.7*  --   --   HGB 16.3 17.0  13.7  HCT 46.9 50.0 41.1  MCV 93.6  --  96.0  PLT 313  --  229   Basic Metabolic Panel: Recent Labs  Lab 08/26/23 1017 08/26/23 1121 08/26/23 1135 08/26/23 2029 08/27/23 0041 08/27/23 0358  NA 135 136 135 139 139 138  K 5.5* 5.2* 5.0 4.1 4.0 4.0  CL 95* 97*  --  110 108 106  CO2 13* 15*  --  17* 21* 23  GLUCOSE 437* 393*  --  102* 129* 123*  BUN 46* 44*  --  37* 35* 31*  CREATININE 2.27* 2.15*  --  1.47* 1.66* 1.43*  CALCIUM  11.0* 11.4*  --  9.0 9.0 8.7*   GFR: Estimated Creatinine Clearance: 40.5 mL/min (A) (by C-G formula based on SCr of 1.43 mg/dL (H)). Liver Function Tests: Recent Labs  Lab 08/26/23 1017 08/27/23 0358  AST 15 20  ALT 14 13  ALKPHOS 88 54  BILITOT 0.6 0.6  PROT 7.7 5.8*  ALBUMIN 4.2 2.8*   No results for input(s): LIPASE, AMYLASE in the last 168 hours. No results for input(s): AMMONIA in the last 168 hours. Coagulation Profile: Recent Labs  Lab 08/26/23 1510  INR 1.1   Cardiac Enzymes: No results for input(s): CKTOTAL, CKMB, CKMBINDEX, TROPONINI in the last 168 hours. BNP (last 3 results) No results for input(s): PROBNP in the last 8760 hours. HbA1C: No results for input(s): HGBA1C in the last 72 hours. CBG: Recent Labs  Lab 08/27/23 1151 08/27/23 1520 08/27/23 2052 08/27/23 2348 08/28/23 0402  GLUCAP 78 181* 189* 84 125*   Lipid Profile: No results for input(s): CHOL, HDL, LDLCALC, TRIG, CHOLHDL, LDLDIRECT in the last 72 hours. Thyroid  Function Tests: No results for input(s): TSH, T4TOTAL, FREET4, T3FREE, THYROIDAB in the last 72 hours. Anemia Panel: No results for input(s): VITAMINB12, FOLATE, FERRITIN, TIBC, IRON, RETICCTPCT in the last 72 hours. Sepsis Labs: Recent Labs  Lab 08/26/23 1121 08/26/23 1320 08/26/23 1510 08/26/23 1801  LATICACIDVEN 2.7* 3.5* 3.1* 1.4    Recent Results (from the past 240 hours)  Urine Culture     Status: None   Collection Time: 08/26/23  11:20 AM   Specimen: Urine, Clean Catch  Result Value Ref Range Status   Specimen Description   Final    URINE, CLEAN CATCH Performed at [facility], [address], [place], [place] [address]    Special Requests   Final    NONE Performed at [facility], [address], [place], [place] [address]    Culture   Final    NO GROWTH Performed at [facility], [address], [place], [place] [address]    Report Status 08/27/2023 FINAL  Final  MRSA Next Gen by PCR, Nasal     Status: None   Collection Time: 08/26/23  2:01 PM   Specimen: Nasal Mucosa; Nasal Swab  Result Value Ref Range Status   MRSA by PCR Next Gen NOT DETECTED NOT DETECTED Final    Comment: (NOTE) The GeneXpert MRSA Assay (FDA approved for NASAL specimens only), is one component of a comprehensive MRSA colonization surveillance  program. It is not intended to diagnose MRSA infection nor to guide or monitor treatment for MRSA infections. Test performance is not FDA approved in patients less than 38 years old. Performed at [facility], [address], [place], [place] [address]          Radiology Studies: IR NEPHROSTOMY PLACEMENT LEFT Result Date: 08/27/2023 INDICATION: Left-sided hydronephrosis. EXAM: Nephrostomy tube placement COMPARISON:  None Available. MEDICATIONS: ; The antibiotic was administered in an appropriate time frame prior to skin puncture. ANESTHESIA/SEDATION: Moderate (conscious) sedation was employed during this procedure. A total of Versed  mg and Fentanyl  mcg was administered intravenously by the radiology nurse. Total intra-service moderate Sedation Time: minutes. The patient's level of consciousness and vital signs were monitored continuously by radiology nursing throughout the procedure under my direct supervision. CONTRAST:  10 mL Omnipaque  300-administered into the collecting system(s) FLUOROSCOPY: Radiation Exposure Index (as  provided by the fluoroscopic device): 4 mGy Kerma COMPLICATIONS: None immediate. PROCEDURE: Informed written consent was obtained from the patient after a thorough discussion of the procedural risks, benefits and alternatives. All questions were addressed. Maximal Sterile Barrier Technique was utilized including caps, mask, sterile gowns, sterile gloves, sterile drape, hand hygiene and skin antiseptic. A timeout was performed prior to the initiation of the procedure. In a prone position, the left flank was prepped and draped in usual sterile fashion. Ultrasound was used to initially evaluate kidney which demonstrated moderate hydronephrosis. Local anesthesia was achieved by infiltrating subcutaneous tissue to the level of the renal capsule with 1% lidocaine . A needle was then advanced through a small incision in the left flank region under ultrasound guidance until the needle tip was identified within the collecting system. Dilute contrast was then injected demonstrating intraluminal position and filling of the left renal pelvis. The renal pelvis and calices are moderately dilated. An 018 guidewire was then advanced under fluoroscopy and coiled within the renal pelvis. The needle was exchanged for a long access sheath. The access sheath was advanced into the renal pelvis. Then the access was exchanged over an 035 guidewire for a fascial dilator. After the tract was dilated, a 10 French pigtail catheter was advanced over the guidewire and coiled within the renal pelvis. Catheter was connected to gravity drainage, retention suture was applied, sterile dressing was applied. IMPRESSION: Satisfactory placement of a 10 French left percutaneous nephrostomy tube. The patient will return in 6-8 weeks for routine exchange. Electronically Signed   By: Cordella Banner   On: 08/27/2023 11:34   DG Chest Port 1 View Result Date: 08/26/2023 CLINICAL DATA:  Acute dyspnea. EXAM: PORTABLE CHEST 1 VIEW COMPARISON:  December 11, 2019. FINDINGS: The heart size and mediastinal contours are within normal limits. Minimal scarring is noted in right upper lobe. No acute pulmonary disease is noted. The visualized skeletal structures are unremarkable. IMPRESSION: No active disease. Electronically Signed   By: Lynwood Landy Raddle M.D.   On: 08/26/2023 14:44   CT Renal Stone Study Result Date: 08/26/2023 CLINICAL DATA:  Abdominal/flank pain, stone suspected Left lower back pain worsening since July. No known injury. History of diabetes and appendectomy. EXAM: CT ABDOMEN AND PELVIS WITHOUT CONTRAST TECHNIQUE: Multidetector CT imaging of the abdomen and pelvis was performed following the standard protocol without IV contrast. RADIATION DOSE REDUCTION: This exam was performed according to the departmental dose-optimization program which includes automated exposure control, adjustment of the mA and/or kV according to patient size and/or use of iterative reconstruction technique. COMPARISON:  Abdominopelvic CT 03/25/2011.  Chest CT 02/01/2020. FINDINGS: Lower chest: Aortic and coronary artery atherosclerosis noted. The lung bases are clear. There is a small hiatal hernia. Hepatobiliary: The liver has a non cirrhotic morphology. No suspicious focal abnormalities are identified on noncontrast imaging. No evidence of gallstones, gallbladder wall thickening or biliary dilatation. Pancreas: Unremarkable. No pancreatic ductal dilatation or surrounding inflammatory changes. Spleen: Normal in size without focal abnormality. Adrenals/Urinary Tract: Both adrenal glands appear normal. There is a punctate nonobstructing calculus in the interpolar region of the right kidney. No other urinary tract calculi are demonstrated. New moderate dilatation of the left renal pelvis and calyces with abrupt transition at the ureteropelvic junction. Associated mild asymmetric left perinephric soft tissue stranding, suggesting a degree of acute collecting system obstruction. No  right-sided hydronephrosis. There are simple and mildly complex renal cysts bilaterally for which no specific follow-up imaging is recommended. The bladder appears unremarkable for its degree of distention. Stomach/Bowel: No enteric contrast administered. The stomach appears unremarkable for its degree of distension. No evidence of bowel wall thickening, distention or surrounding inflammatory change. Mildly prominent stool in the proximal colon. Vascular/Lymphatic: There are no enlarged abdominal or pelvic lymph nodes. Aortic and branch vessel atherosclerosis without evidence of aneurysm. Retroaortic left renal vein noted. Reproductive: Mild enlargement of the prostate gland. Other: Stable mildly prominent fat in both inguinal canals. Small umbilical hernia containing only fat. No ascites or pneumoperitoneum. Musculoskeletal: No acute or significant osseous findings. Stable benign appearing sclerotic lesion in the right superior pubic ramus, likely a bone island. IMPRESSION: 1. New moderate dilatation of the left renal pelvis and calyces with abrupt transition at the ureteropelvic junction and perinephric soft tissue stranding, suspicious for acute UPJ obstruction. A specific cause is not demonstrated by this noncontrast study. Differential considerations include recently passed calculus, blood clot and tumor (transitional cell carcinoma). Urology follow-up recommended. 2. Punctate nonobstructing right renal calculus. No other urinary tract calculi. 3. No other acute abdominopelvic findings. 4.  Aortic Atherosclerosis (ICD10-I70.0). Electronically Signed   By: Elsie Perone M.D.   On: 08/26/2023 11:25        Scheduled Meds:  atorvastatin   40 mg Oral Daily   Chlorhexidine  Gluconate Cloth  6 each Topical Daily   insulin  aspart  0-6 Units Subcutaneous Q4H   insulin  glargine-yfgn  5 Units Subcutaneous Q24H   pantoprazole   40 mg Oral BID   sodium chloride  flush  5 mL Intracatheter Q8H   Continuous  Infusions:     LOS: 1 day    Time spent: 35 minutes    Samadhi Mahurin A Jenifer Struve, MD [facility]   If 7PM-7AM, please contact night-coverage [email or web address]  08/28/2023, 7:18 AM

## 2023-08-28 NOTE — Progress Notes (Cosign Needed Addendum)
 Referring Provider(s): Lonni Han  Supervising Physician: Philip Cornet  Patient Status:  [facility] - In-pt  Chief Complaint:  Left hydronephrosis  Brief History:  Luis Shaw is a 78 y.o. male with past medical history significant for diabetes, hyperlipidemia, hypertension.  He presented to [facility] emergency room for persistent abdominal/left flank pain and DKA.    He was transferred to [facility] for higher level of care.  CT abdomen pelvis showed= 1. New moderate dilatation of the left renal pelvis and calyces with abrupt transition at the ureteropelvic junction and perinephric soft tissue stranding, suspicious for acute UPJ obstruction. A specific cause is not demonstrated by this noncontrast study. Differential considerations include recently passed calculus, blood clot and tumor (transitional cell carcinoma). Urology follow-up recommended. 2. Punctate nonobstructing right renal calculus. No other urinary tract calculi.   He underwent percutaneous nephrostomy tube placement by Dr. Jenna yesterday.  Subjective:  As I entered the room he informed me My bag is leaking. On exam, the blue knob at the bottom of the Uresil bag was not closed.   Allergies: Ciprofloxacin  Medications: Prior to Admission medications   Medication Sig Start Date End Date Taking? Authorizing Provider  amLODipine  (NORVASC ) 5 MG tablet Take 5 mg by mouth at bedtime. 12/07/19  Yes [provider]  aspirin  EC 81 MG tablet Take 81 mg by mouth daily at 12 noon.   Yes [provider]  glimepiride (AMARYL) 1 MG tablet Take 1 mg by mouth every morning. 05/17/23  Yes [provider]  glimepiride (AMARYL) 2 MG tablet Take 2 mg by mouth daily with breakfast.   Yes [provider]  ibuprofen (ADVIL,MOTRIN) 200 MG tablet Take 400 mg by mouth daily as needed for mild pain or moderate pain.   Yes [provider]  JARDIANCE 25 MG TABS tablet Take 25 mg by  mouth daily. 12/08/19  Yes [provider]  lisinopril (PRINIVIL,ZESTRIL) 10 MG tablet Take 20 mg by mouth daily at 12 noon.    Yes [provider]  methocarbamol (ROBAXIN) 750 MG tablet Take 750 mg by mouth every 8 (eight) hours. 08/12/23  Yes [provider]  Misc Natural Products (TART CHERRY ADVANCED) CAPS Take 1 tablet by mouth daily. 12/08/19  Yes [provider]  OZEMPIC, 2 MG/DOSE, 8 MG/3ML SOPN Inject 2 mg into the skin once a week.   Yes [provider]  predniSONE (DELTASONE) 10 MG tablet See admin instructions. Day 1: 6 tablets; day 2: 5 tablets; day 3: 4 tablets: day 4: 3 tablets; day 5: 2 tablet; Day 6: 1 tablet. Orally Once a day; Duration: 6 days 08/24/23  Yes [provider]  simvastatin (ZOCOR) 80 MG tablet Take 80 mg by mouth at bedtime. 12/07/19  Yes [provider]  prochlorperazine  (COMPAZINE ) 10 MG tablet Take 1 tablet (10 mg total) by mouth every 6 (six) hours as needed (hiccups). Patient not taking: Reported on 08/26/2023 12/12/19   Odell Balls, PA-C     Vital Signs: BP 124/75 (BP Location: Left Arm)   Pulse 96   Temp 97.7 F (36.5 C) (Oral)   Resp 16   Ht 5' 8 (1.727 m)   Wt 172 lb 13.5 oz (78.4 kg)   SpO2 96%   BMI 26.28 kg/m   Physical Exam Constitutional:      Appearance: Normal appearance.  HENT:     Head: Normocephalic and atraumatic.  Cardiovascular:     Rate and Rhythm: Normal rate.  Pulmonary:     Effort: Pulmonary effort is normal. No respiratory distress.  Abdominal:     Palpations: Abdomen is soft.     Tenderness: There is no abdominal tenderness.  Skin:    General: Skin is warm and dry.  Neurological:     General: No focal deficit present.     Mental Status: He is alert and oriented to person, place, and time.  Psychiatric:        Mood and Affect: Mood normal.        Behavior: Behavior normal.        Thought Content: Thought content normal.        Judgment: Judgment normal.     Left flank PCN site looks good. Upon exam, there was a 3 way stop cock which had been placed between the drain and the gravity bag. This was removed as it is not our practice to place stop cocks on PCNs.  The blue knob was not tightened and was leaking on the patients gown and bed. I closed the knob to avoid further leakage.  Labs:  CBC: Recent Labs    08/26/23 1017 08/26/23 1135 08/27/23 0358 08/28/23 0927  WBC 17.8*  --  11.4* 10.2  HGB 16.3 17.0 13.7 16.7  HCT 46.9 50.0 41.1 49.7  PLT 313  --  229 257    COAGS: Recent Labs    08/26/23 1510  INR 1.1    BMP: Recent Labs    08/26/23 1121 08/26/23 1135 08/26/23 2029 08/27/23 0041 08/27/23 0358  NA 136 135 139 139 138  K 5.2* 5.0 4.1 4.0 4.0  CL 97*  --  110 108 106  CO2 15*  --  17* 21* 23  GLUCOSE 393*  --  102* 129* 123*  BUN 44*  --  37* 35* 31*  CALCIUM  11.4*  --  9.0 9.0 8.7*  CREATININE 2.15*  --  1.47* 1.66* 1.43*  GFRNONAA 31*  --  48* 42* 50*    LIVER FUNCTION TESTS: Recent Labs    08/26/23 1017 08/27/23 0358  BILITOT 0.6 0.6  AST 15 20  ALT 14 13  ALKPHOS 88 54  PROT 7.7 5.8*  ALBUMIN 4.2 2.8*    Assessment and Plan:  Acute UPJ obstruction - s/p placement of a left PCN yesterday by Dr. Jenna.  He had a stop cock on the PCN, I removed that as we do not place stop cocks on PCNs. These just confuse the patients, they turn it the wrong way and it either leaks or stops drainage resulting in hydro.   His gown was wet because the bag was not closed. I turned the blue knob on the bottom of the drainage bag to close it and I demonstrated how to care for it with his wife (opening the blue knob, emptying, closing the blue knob). I have given his nurse instructions as well.  The patient does not need to flush the PCN as long as urine is clear and no debris in the tubing.   He tells me he has a f/u with Dr. Devere for definitive plans - ? Ureteral stent placement with removal of the PCN.  If  his PCN remains up to 8 weeks, he will need routine exchange with IR.  Electronically Signed: SARI GORMAN LAMP, PA-C 08/28/2023, 11:24 AM    I spent a total of 25 Minutes at the the patient's bedside AND on the patient's hospital floor or unit, greater than 50% of which  was counseling/coordinating care for PCN follow up and instructions/demonstration of care of tube with patient and his wife.

## 2023-08-28 NOTE — Discharge Instructions (Signed)
  Please call [facility] Radiology at [phone] with any questions or concerns regarding the nephrostomy. It will need to be exchanged in about 8 weeks unless otherwise informed by your Urologist.

## 2023-08-29 DIAGNOSIS — E1110 Type 2 diabetes mellitus with ketoacidosis without coma: Secondary | ICD-10-CM | POA: Diagnosis not present

## 2023-08-29 LAB — GLUCOSE, CAPILLARY
Glucose-Capillary: 160 mg/dL — ABNORMAL HIGH (ref 70–99)
Glucose-Capillary: 154 mg/dL — ABNORMAL HIGH (ref 70–99)

## 2023-08-29 LAB — BASIC METABOLIC PANEL WITH GFR
Anion gap: 9 (ref 5–15)
BUN: 23 mg/dL (ref 8–23)
CO2: 24 mmol/L (ref 22–32)
Calcium: 8.6 mg/dL — ABNORMAL LOW (ref 8.9–10.3)
Chloride: 103 mmol/L (ref 98–111)
Creatinine, Ser: 0.98 mg/dL (ref 0.61–1.24)
GFR, Estimated: >60 mL/min (ref >60)
Glucose, Bld: 185 mg/dL — ABNORMAL HIGH (ref 70–99)
Potassium: 4.4 mmol/L (ref 3.5–5.1)
Sodium: 136 mmol/L (ref 135–145)

## 2023-08-29 MED ORDER — PANTOPRAZOLE SODIUM 40 MG PO TBEC: 40.0000 mg | DELAYED_RELEASE_TABLET | Freq: Every day | ORAL | 0 refills | Status: DC

## 2023-08-29 MED ORDER — AMLODIPINE BESYLATE 5 MG PO TABS: 5.0000 mg | ORAL_TABLET | Freq: Every day | ORAL | Status: DC

## 2023-08-29 MED ORDER — ASPIRIN 81 MG PO TBEC
81.0000 mg | DELAYED_RELEASE_TABLET | Freq: Every day | ORAL | Status: DC
  Administered 2023-08-29: 81 mg via ORAL
  Filled 2023-08-29: qty 1

## 2023-08-29 MED ORDER — POLYETHYLENE GLYCOL 3350 17 G PO PACK: 17.0000 g | PACK | Freq: Every day | ORAL | 0 refills | Status: DC

## 2023-08-29 MED ORDER — POLYETHYLENE GLYCOL 3350 17 G PO PACK
17.0000 g | PACK | Freq: Every day | ORAL | 0 refills | Status: DC
Start: 2023-08-30 — End: 2023-10-04

## 2023-08-29 NOTE — Plan of Care (Signed)
 Discharge instructions given to the patient and his wife including medications, dressing change, drain care and follow ups.

## 2023-08-29 NOTE — Discharge Summary (Signed)
 Physician Discharge Summary   Patient: Luis Shaw MRN: [id number] DOB: May 26, 1945  Admit date:     08/26/2023  Discharge date: 08/29/23  Discharge Physician: Owen DELENA Lore   PCP: Charlott Dorn DELENA, MD   Recommendations at discharge:    Needs to follow up with Urology for Diagnostic Ureteroscopy   Needs Bmet to follow renal function  Needs follow up on CBG  Discharge Diagnoses: Principal Problem:   DKA (diabetic ketoacidosis) (HCC) Active Problems:   Hyperkalemia   Dehydration   Hypercalcemia   Elevated serum creatinine   Leukocytosis   Obstruction of left ureteropelvic junction (UPJ)  Resolved Problems:   * No resolved hospital problems. *  Hospital Course: 78 year old with past medical history significant for diabetes type 2, hyperlipidemia, hypertension presented to the ED complaining of left-sided abdominal pain and flank pain for the last 3 weeks getting progressively worse.  He also complained of numbness and tingling in his lower extremities.  Incidentally he was found to be in DKA.   Evaluation ED pH 7.3, bicarbonate 15, CO2 16, glucose 437, BUN 46, creatinine 2.2 lactic acid 2.7, beta hydroxybutyric acid 4.8.   CT renal stone protocol showed new moderate dilation of the left renal pelvis and calyces with transition at the ureteropelvic junction and perinephric soft tissue stranding suspicious for acute UPJ obstruction.   Urology was consulted and recommended IR consultation for percutaneous nephrostomy tube placement.  Patient will also require diagnostic ureteroscopy in the coming weeks per urology.  Assessment and Plan: 1-DKA, diabetes type 2 Lactic acidosis -Patient presents with bicarb of 13, glucose 437, beta butyrate acid 4 - He was started on IV fluids and insulin  drip -transition to Semglee  and SSI Resume tresiba at discharge  Obstructive left ureteropelvic junction AKI -Patient presented with a creatinine of 2.2, suspect related to  hypovolemia, obstructive uropathy. - Creatinine down to 1.4 - Follow urine culture: No growth to date.  - Urology consulted and recommended IR consultation for percutaneous nephrostomy tube placement.  Patient will acquire diagnostic ureteroscopy in the coming several weeks -underwent left PCN by IR 8/08 Renal function improved.    Hyperkalemia -In the setting of DKA and AKI.  Resolved with fluids   Hypercalcemia -In the setting of hypovolemia dehydration   Hypertension: Continue with  Norvasc .  Will hold lisinopril   Leukocytosis -Trending down - Follow urine culture: No growth to date   Hyperlipidemia: Resume statin Constipation: Miralax  daily          Consultants: Urology, IR  Procedures performed: Nephrostomy tube  Disposition: Home Diet recommendation:  Discharge Diet Orders (From admission, onward)     Start     Ordered   08/29/23 0000  Diet Carb Modified        08/29/23 1102           Carb modified diet DISCHARGE MEDICATION: Allergies as of 08/29/2023       Reactions   Ciprofloxacin Itching   Full body rash         Medication List     STOP taking these medications    methocarbamol 750 MG tablet Commonly known as: ROBAXIN       TAKE these medications    amLODipine  5 MG tablet Commonly known as: NORVASC  Take 5 mg by mouth at bedtime.   aspirin  EC 81 MG tablet Take 81 mg by mouth daily at 12 noon.   pantoprazole  40 MG tablet Commonly known as: PROTONIX  Take 1 tablet (40 mg total) by  mouth daily.   polyethylene glycol 17 g packet Commonly known as: MIRALAX  / GLYCOLAX  Take 17 g by mouth daily. Start taking on: August 30, 2023   simvastatin 80 MG tablet Commonly known as: ZOCOR Take 80 mg by mouth at bedtime.   Tart Cherry Advanced Caps Take 1 tablet by mouth daily.   Tresiba FlexTouch 100 UNIT/ML FlexTouch Pen Generic drug: insulin  degludec Inject 10 Units into the skin daily.               Discharge Care  Instructions  (From admission, onward)           Start     Ordered   08/29/23 0000  Discharge wound care:       Comments: See IR recommendations   08/29/23 1102            Follow-up Information     Devere Lonni Righter, MD Follow up.   Specialty: Urology Why: My office will call to schedule a follow-up appointment in approximately 2 weeks. Contact information: [address] [place] [address] [phone]         Charlott Dorn LABOR, MD Follow up in 1 week(s).   Specialty: Internal Medicine Contact information: [address] [place] [address] [phone]                Discharge Exam: [name]   08/26/23 1123 08/26/23 1405 08/28/23 0200  Weight: 76.7 kg 76.8 kg 78.4 kg   General NAD  Condition at discharge: stable  The results of significant diagnostics from this hospitalization (including imaging, microbiology, ancillary and laboratory) are listed below for reference.   Imaging Studies: IR NEPHROSTOMY PLACEMENT LEFT Result Date: 08/27/2023 INDICATION: Left-sided hydronephrosis. EXAM: Nephrostomy tube placement COMPARISON:  None Available. MEDICATIONS: ; The antibiotic was administered in an appropriate time frame prior to skin puncture. ANESTHESIA/SEDATION: Moderate (conscious) sedation was employed during this procedure. A total of Versed  mg and Fentanyl  mcg was administered intravenously by the radiology nurse. Total intra-service moderate Sedation Time: minutes. The patient's level of consciousness and vital signs were monitored continuously by radiology nursing throughout the procedure under my direct supervision. CONTRAST:  10 mL Omnipaque  300-administered into the collecting system(s) FLUOROSCOPY: Radiation Exposure Index (as provided by the fluoroscopic device): 4 mGy Kerma COMPLICATIONS: None immediate. PROCEDURE: Informed written consent was obtained from the patient after a thorough discussion of the  procedural risks, benefits and alternatives. All questions were addressed. Maximal Sterile Barrier Technique was utilized including caps, mask, sterile gowns, sterile gloves, sterile drape, hand hygiene and skin antiseptic. A timeout was performed prior to the initiation of the procedure. In a prone position, the left flank was prepped and draped in usual sterile fashion. Ultrasound was used to initially evaluate kidney which demonstrated moderate hydronephrosis. Local anesthesia was achieved by infiltrating subcutaneous tissue to the level of the renal capsule with 1% lidocaine . A needle was then advanced through a small incision in the left flank region under ultrasound guidance until the needle tip was identified within the collecting system. Dilute contrast was then injected demonstrating intraluminal position and filling of the left renal pelvis. The renal pelvis and calices are moderately dilated. An 018 guidewire was then advanced under fluoroscopy and coiled within the renal pelvis. The needle was exchanged for a long access sheath. The access sheath was advanced into the renal pelvis. Then the access was exchanged over an 035 guidewire for a fascial dilator. After the tract was dilated,  a 10 French pigtail catheter was advanced over the guidewire and coiled within the renal pelvis. Catheter was connected to gravity drainage, retention suture was applied, sterile dressing was applied. IMPRESSION: Satisfactory placement of a 10 French left percutaneous nephrostomy tube. The patient will return in 6-8 weeks for routine exchange. Electronically Signed   By: Cordella Banner   On: 08/27/2023 11:34   DG Chest Port 1 View Result Date: 08/26/2023 CLINICAL DATA:  Acute dyspnea. EXAM: PORTABLE CHEST 1 VIEW COMPARISON:  December 11, 2019. FINDINGS: The heart size and mediastinal contours are within normal limits. Minimal scarring is noted in right upper lobe. No acute pulmonary disease is noted. The visualized  skeletal structures are unremarkable. IMPRESSION: No active disease. Electronically Signed   By: Lynwood Landy Raddle M.D.   On: 08/26/2023 14:44   CT Renal Stone Study Result Date: 08/26/2023 CLINICAL DATA:  Abdominal/flank pain, stone suspected Left lower back pain worsening since July. No known injury. History of diabetes and appendectomy. EXAM: CT ABDOMEN AND PELVIS WITHOUT CONTRAST TECHNIQUE: Multidetector CT imaging of the abdomen and pelvis was performed following the standard protocol without IV contrast. RADIATION DOSE REDUCTION: This exam was performed according to the departmental dose-optimization program which includes automated exposure control, adjustment of the mA and/or kV according to patient size and/or use of iterative reconstruction technique. COMPARISON:  Abdominopelvic CT 03/25/2011.  Chest CT 02/01/2020. FINDINGS: Lower chest: Aortic and coronary artery atherosclerosis noted. The lung bases are clear. There is a small hiatal hernia. Hepatobiliary: The liver has a non cirrhotic morphology. No suspicious focal abnormalities are identified on noncontrast imaging. No evidence of gallstones, gallbladder wall thickening or biliary dilatation. Pancreas: Unremarkable. No pancreatic ductal dilatation or surrounding inflammatory changes. Spleen: Normal in size without focal abnormality. Adrenals/Urinary Tract: Both adrenal glands appear normal. There is a punctate nonobstructing calculus in the interpolar region of the right kidney. No other urinary tract calculi are demonstrated. New moderate dilatation of the left renal pelvis and calyces with abrupt transition at the ureteropelvic junction. Associated mild asymmetric left perinephric soft tissue stranding, suggesting a degree of acute collecting system obstruction. No right-sided hydronephrosis. There are simple and mildly complex renal cysts bilaterally for which no specific follow-up imaging is recommended. The bladder appears unremarkable for its  degree of distention. Stomach/Bowel: No enteric contrast administered. The stomach appears unremarkable for its degree of distension. No evidence of bowel wall thickening, distention or surrounding inflammatory change. Mildly prominent stool in the proximal colon. Vascular/Lymphatic: There are no enlarged abdominal or pelvic lymph nodes. Aortic and branch vessel atherosclerosis without evidence of aneurysm. Retroaortic left renal vein noted. Reproductive: Mild enlargement of the prostate gland. Other: Stable mildly prominent fat in both inguinal canals. Small umbilical hernia containing only fat. No ascites or pneumoperitoneum. Musculoskeletal: No acute or significant osseous findings. Stable benign appearing sclerotic lesion in the right superior pubic ramus, likely a bone island. IMPRESSION: 1. New moderate dilatation of the left renal pelvis and calyces with abrupt transition at the ureteropelvic junction and perinephric soft tissue stranding, suspicious for acute UPJ obstruction. A specific cause is not demonstrated by this noncontrast study. Differential considerations include recently passed calculus, blood clot and tumor (transitional cell carcinoma). Urology follow-up recommended. 2. Punctate nonobstructing right renal calculus. No other urinary tract calculi. 3. No other acute abdominopelvic findings. 4.  Aortic Atherosclerosis (ICD10-I70.0). Electronically Signed   By: Elsie Perone M.D.   On: 08/26/2023 11:25    Microbiology: Results for orders placed or performed during  the hospital encounter of 08/26/23  Urine Culture     Status: None   Collection Time: 08/26/23 11:20 AM   Specimen: Urine, Clean Catch  Result Value Ref Range Status   Specimen Description   Final    URINE, CLEAN CATCH Performed at [facility], [address], [place], [place] [address]    Special Requests   Final    NONE Performed at [facility], [address] [place],  [place] [address]    Culture   Final    NO GROWTH Performed at [facility], [address], [place], [place] [address]    Report Status 08/27/2023 FINAL  Final  MRSA Next Gen by PCR, Nasal     Status: None   Collection Time: 08/26/23  2:01 PM   Specimen: Nasal Mucosa; Nasal Swab  Result Value Ref Range Status   MRSA by PCR Next Gen NOT DETECTED NOT DETECTED Final    Comment: (NOTE) The GeneXpert MRSA Assay (FDA approved for NASAL specimens only), is one component of a comprehensive MRSA colonization surveillance program. It is not intended to diagnose MRSA infection nor to guide or monitor treatment for MRSA infections. Test performance is not FDA approved in patients less than 33 years old. Performed at [facility], [address] [place], [place] [address]     Labs: CBC: Recent Labs  Lab 08/26/23 1017 08/26/23 1135 08/27/23 0358 08/28/23 0927  WBC 17.8*  --  11.4* 10.2  NEUTROABS 15.7*  --   --   --   HGB 16.3 17.0 13.7 16.7  HCT 46.9 50.0 41.1 49.7  MCV 93.6  --  96.0 97.1  PLT 313  --  229 257   Basic Metabolic Panel: Recent Labs  Lab 08/26/23 2029 08/27/23 0041 08/27/23 0358 08/28/23 0927 08/29/23 0717  NA 139 139 138 135 136  K 4.1 4.0 4.0 4.2 4.4  CL 110 108 106 99 103  CO2 17* 21* 23 19* 24  GLUCOSE 102* 129* 123* 172* 185*  BUN 37* 35* 31* 25* 23  CREATININE 1.47* 1.66* 1.43* 1.35* 0.98  CALCIUM  9.0 9.0 8.7* 8.9 8.6*   Liver Function Tests: Recent Labs  Lab 08/26/23 1017 08/27/23 0358  AST 15 20  ALT 14 13  ALKPHOS 88 54  BILITOT 0.6 0.6  PROT 7.7 5.8*  ALBUMIN 4.2 2.8*   CBG: Recent Labs  Lab 08/28/23 1212 08/28/23 1633 08/28/23 1817 08/28/23 2217 08/29/23 0747  GLUCAP 190* 145* 206* 119* 160*    Discharge time spent: greater than 30 minutes.  Signed: Owen DELENA Lore, MD [facility] 08/29/2023

## 2023-08-29 NOTE — Evaluation (Signed)
 Occupational Therapy Evaluation Patient Details Name: Luis Shaw MRN: [id number] DOB: 1946-01-05 Today's Date: 08/29/2023   History of Present Illness   78 y.o. male with pt presented to [facility] emergency room for persistent abdominal/left flank pain and DKA. He was transferred to [facility] for higher level of care.  CT abdomen pelvis showed New moderate dilatation of the left renal pelvis and calyces with  abrupt transition at the ureteropelvic junction and perinephric soft  tissue stranding, suspicious for acute UPJ obstruction. underwent percutaneous nephrostomy tube placement on 8/9.   past medical history significant for diabetes, hyperlipidemia, hypertension.     Clinical Impressions Patient evaluated by Occupational Therapy with no further acute OT needs identified. All education has been completed and the patient has no further questions. Wife present for session and OT educated and issued 5 P's of Energy conservation due to patient's prolonged medical course. Breathing strategies and use of shower seat and reacher reinforced.  See below for any follow-up Occupational Therapy or equipment needs. OT is signing off. Thank you for this referral.      If plan is discharge home, recommend the following:   A little help with walking and/or transfers;A little help with bathing/dressing/bathroom;Assistance with cooking/housework;Assist for transportation;Help with stairs or ramp for entrance     Functional Status Assessment   Patient has had a recent decline in their functional status and demonstrates the ability to make significant improvements in function in a reasonable and predictable amount of time.     Equipment Recommendations   None recommended by OT      Precautions/Restrictions   Precautions Precautions: None Precaution/Restrictions Comments: drain Restrictions Weight Bearing Restrictions Per Provider Order: No     Mobility Bed Mobility Overal bed  mobility: Independent                  Transfers Overall transfer level: Independent                        Balance Overall balance assessment: No apparent balance deficits (not formally assessed)                                         ADL either performed or assessed with clinical judgement   ADL Overall ADL's : Modified independent                                       General ADL Comments: able to complete figure 4 for tennis shoes including tying EOB, has reacher at home to integrate and shower seat for ECT     Vision Baseline Vision/History: 0 No visual deficits Vision Assessment?: No apparent visual deficits            Pertinent Vitals/Pain Pain Assessment Pain Assessment: No/denies pain     Extremity/Trunk Assessment Upper Extremity Assessment Upper Extremity Assessment: Overall WFL for tasks assessed;Right hand dominant   Lower Extremity Assessment Lower Extremity Assessment: Defer to PT evaluation   Cervical / Trunk Assessment Cervical / Trunk Assessment: Normal   Communication Communication Communication: Impaired Factors Affecting Communication: Hearing impaired   Cognition Arousal: Alert Behavior During Therapy: WFL for tasks assessed/performed Cognition: No apparent impairments  Following commands: Intact       Cueing  General Comments   Cueing Techniques: Verbal cues  educated on 5Ps of ECT since patient has had prolonged bed rest for > 4 weeks, wife present for education for pacing, patient noted need for rest after 1/2 hallway amb but able to teach back indep   Exercises Exercises: Other exercises (pursed lip breathing strategies)        Home Living Family/patient expects to be discharged to:: Private residence Living Arrangements: Spouse/significant other Available Help at Discharge: Family Type of Home: House Home Access: Stairs to  enter Secretary/administrator of Steps: 3 Entrance Stairs-Rails: Right;Left;Can reach both Home Layout: One level     Bathroom Shower/Tub: Walk-in shower;Door   [employer] Toilet: Handicapped height Bathroom Accessibility: Yes How Accessible: Accessible via walker Home Equipment: Grab bars - tub/shower;Adaptive equipment;Shower Engineering geologist: Reacher        Prior Functioning/Environment Prior Level of Function : Independent/Modified Independent                     AM-PAC OT 6 Clicks Daily Activity     Outcome Measure Help from another person eating meals?: None Help from another person taking care of personal grooming?: None Help from another person toileting, which includes using toliet, bedpan, or urinal?: None Help from another person bathing (including washing, rinsing, drying)?: None Help from another person to put on and taking off regular upper body clothing?: None Help from another person to put on and taking off regular lower body clothing?: None 6 Click Score: 24   End of Session Equipment Utilized During Treatment: Gait belt Nurse Communication: Mobility status  Activity Tolerance: Patient limited by fatigue Patient left: in chair;with call bell/phone within reach;with chair alarm set;with family/visitor present                   Time: 9088-9057 OT Time Calculation (min): 31 min Charges:  OT General Charges $OT Visit: 1 Visit OT Evaluation $OT Eval Low Complexity: 1 Low OT Treatments $Therapeutic Activity: 8-22 mins Larren Copes OT/L Acute Rehabilitation Department  [phone]  08/29/2023, 10:14 AM

## 2023-09-07 DIAGNOSIS — N135 Crossing vessel and stricture of ureter without hydronephrosis: Secondary | ICD-10-CM | POA: Diagnosis not present

## 2023-09-07 DIAGNOSIS — I10 Essential (primary) hypertension: Secondary | ICD-10-CM | POA: Diagnosis not present

## 2023-09-07 DIAGNOSIS — Z1211 Encounter for screening for malignant neoplasm of colon: Secondary | ICD-10-CM | POA: Diagnosis not present

## 2023-09-07 DIAGNOSIS — E1165 Type 2 diabetes mellitus with hyperglycemia: Secondary | ICD-10-CM | POA: Diagnosis not present

## 2023-09-07 DIAGNOSIS — E1122 Type 2 diabetes mellitus with diabetic chronic kidney disease: Secondary | ICD-10-CM | POA: Diagnosis not present

## 2023-09-10 ENCOUNTER — Emergency Department (HOSPITAL_COMMUNITY)

## 2023-09-10 ENCOUNTER — Other Ambulatory Visit: Payer: Self-pay

## 2023-09-10 ENCOUNTER — Emergency Department (HOSPITAL_COMMUNITY)
Admission: EM | Admit: 2023-09-10 | Discharge: 2023-09-10 | Disposition: A | Discharge status: Home / Self Care | Attending: Emergency Medicine | Admitting: Emergency Medicine

## 2023-09-10 DIAGNOSIS — M549 Dorsalgia, unspecified: Secondary | ICD-10-CM | POA: Insufficient documentation

## 2023-09-10 DIAGNOSIS — N12 Tubulo-interstitial nephritis, not specified as acute or chronic: Secondary | ICD-10-CM

## 2023-09-10 DIAGNOSIS — Z794 Long term (current) use of insulin: Secondary | ICD-10-CM | POA: Insufficient documentation

## 2023-09-10 DIAGNOSIS — R109 Unspecified abdominal pain: Secondary | ICD-10-CM | POA: Insufficient documentation

## 2023-09-10 DIAGNOSIS — N200 Calculus of kidney: Secondary | ICD-10-CM | POA: Diagnosis not present

## 2023-09-10 DIAGNOSIS — Z7982 Long term (current) use of aspirin: Secondary | ICD-10-CM | POA: Insufficient documentation

## 2023-09-10 LAB — URINALYSIS, ROUTINE W REFLEX MICROSCOPIC
Bilirubin Urine: NEGATIVE
Glucose, UA: NEGATIVE mg/dL
Hgb urine dipstick: NEGATIVE
Ketones, ur: NEGATIVE mg/dL
Nitrite: NEGATIVE
Protein, ur: 100 mg/dL — AB
Specific Gravity, Urine: 1.02 (ref 1.005–1.030)
WBC, UA: >50 WBC/hpf (ref 0–5)
pH: 5 (ref 5.0–8.0)

## 2023-09-10 LAB — CBC
HCT: 41.4 % (ref 39.0–52.0)
Hemoglobin: 13.9 g/dL (ref 13.0–17.0)
MCH: 32.3 pg (ref 26.0–34.0)
MCHC: 33.6 g/dL (ref 30.0–36.0)
MCV: 96.3 fL (ref 80.0–100.0)
Platelets: 257 K/uL (ref 150–400)
RBC: 4.3 MIL/uL (ref 4.22–5.81)
RDW: 12.1 % (ref 11.5–15.5)
WBC: 10.3 K/uL (ref 4.0–10.5)
nRBC: 0 % (ref 0.0–0.2)

## 2023-09-10 LAB — BASIC METABOLIC PANEL WITH GFR
Anion gap: 11 (ref 5–15)
BUN: 21 mg/dL (ref 8–23)
CO2: 24 mmol/L (ref 22–32)
Calcium: 8.9 mg/dL (ref 8.9–10.3)
Chloride: 102 mmol/L (ref 98–111)
Creatinine, Ser: 1.07 mg/dL (ref 0.61–1.24)
GFR, Estimated: >60 mL/min (ref >60)
Glucose, Bld: 197 mg/dL — ABNORMAL HIGH (ref 70–99)
Potassium: 4.3 mmol/L (ref 3.5–5.1)
Sodium: 137 mmol/L (ref 135–145)

## 2023-09-10 MED ORDER — CEPHALEXIN 500 MG PO CAPS: 500.0000 mg | ORAL_CAPSULE | Freq: Four times a day (QID) | ORAL | 0 refills | Status: AC

## 2023-09-10 MED ORDER — OXYCODONE-ACETAMINOPHEN 5-325 MG PO TABS
1.0000 | ORAL_TABLET | Freq: Four times a day (QID) | ORAL | 0 refills | Status: DC | PRN
Start: 2023-09-10 — End: 2023-10-04

## 2023-09-10 MED ORDER — OXYCODONE-ACETAMINOPHEN 5-325 MG PO TABS
1.0000 | ORAL_TABLET | Freq: Four times a day (QID) | ORAL | 0 refills | Status: DC | PRN
Start: 2023-09-10 — End: 2023-09-10

## 2023-09-10 MED ORDER — CEPHALEXIN 500 MG PO CAPS
500.0000 mg | ORAL_CAPSULE | Freq: Four times a day (QID) | ORAL | 0 refills | Status: DC
Start: 2023-09-10 — End: 2023-09-10

## 2023-09-10 NOTE — ED Provider Notes (Signed)
 [place] EMERGENCY DEPARTMENT AT [facility] Provider Note   CSN: [id number] Arrival date & time: 09/10/23  0830     Patient presents with: Flank Pain   Luis Shaw is a 78 y.o. male.   78 year old male presents with pain to his left flank which began yesterday.  Does have a nephrostomy tube for 2 weeks due to obstruction.  Denies any fever or chills.  States that the pain has been persistent and does not radiate to his groin.  No nausea or vomiting.  Has used home medications without relief       Prior to Admission medications   Medication Sig Start Date End Date Taking? Authorizing Provider  amLODipine  (NORVASC ) 5 MG tablet Take 5 mg by mouth at bedtime. 12/07/19   [provider]  aspirin  EC 81 MG tablet Take 81 mg by mouth daily at 12 noon.    [provider]  insulin  degludec (TRESIBA FLEXTOUCH) 100 UNIT/ML FlexTouch Pen Inject 10 Units into the skin daily. 08/25/23   [provider]  Misc Natural Products (TART CHERRY ADVANCED) CAPS Take 1 tablet by mouth daily. 12/08/19   [provider]  pantoprazole  (PROTONIX ) 40 MG tablet Take 1 tablet (40 mg total) by mouth daily. 08/29/23 09/28/23  Regalado, Belkys A, MD  polyethylene glycol (MIRALAX  / GLYCOLAX ) 17 g packet Take 17 g by mouth daily. 08/30/23   Regalado, Belkys A, MD  simvastatin (ZOCOR) 80 MG tablet Take 80 mg by mouth at bedtime. 12/07/19   [provider]    Allergies: Ciprofloxacin    Review of Systems  All other systems reviewed and are negative.   Updated Vital Signs BP (!) 142/79 (BP Location: Right Arm)   Pulse 78   Temp (!) 97.5 F (36.4 C) (Oral)   Resp 16   SpO2 98%   Physical Exam Vitals and nursing note reviewed.  Constitutional:      General: He is not in acute distress.    Appearance: Normal appearance. He is well-developed. He is not toxic-appearing.  HENT:     Head: Normocephalic and atraumatic.  Eyes:     General: Lids are  normal.     Conjunctiva/sclera: Conjunctivae normal.     Pupils: Pupils are equal, round, and reactive to light.  Neck:     Thyroid : No thyroid  mass.     Trachea: No tracheal deviation.  Cardiovascular:     Rate and Rhythm: Normal rate and regular rhythm.     Heart sounds: Normal heart sounds. No murmur heard.    No gallop.  Pulmonary:     Effort: Pulmonary effort is normal. No respiratory distress.     Breath sounds: Normal breath sounds. No stridor. No decreased breath sounds, wheezing, rhonchi or rales.  Abdominal:     General: There is no distension.     Palpations: Abdomen is soft.     Tenderness: There is no abdominal tenderness. There is no rebound.  Musculoskeletal:        General: No tenderness. Normal range of motion.     Cervical back: Normal range of motion and neck supple.       Back:  Skin:    General: Skin is warm and dry.     Findings: No abrasion or rash.  Neurological:     Mental Status: He is alert and oriented to person, place, and time. Mental status is at baseline.     GCS: GCS eye subscore is 4. GCS verbal  subscore is 5. GCS motor subscore is 6.     Cranial Nerves: No cranial nerve deficit.     Sensory: No sensory deficit.     Motor: Motor function is intact.  Psychiatric:        Attention and Perception: Attention normal.        Speech: Speech normal.        Behavior: Behavior normal.     (all labs ordered are listed, but only abnormal results are displayed) Labs Reviewed  URINALYSIS, ROUTINE W REFLEX MICROSCOPIC - Abnormal; Notable for the following components:      Result Value   APPearance HAZY (*)    Protein, ur 100 (*)    Leukocytes,Ua LARGE (*)    Bacteria, UA RARE (*)    All other components within normal limits  BASIC METABOLIC PANEL WITH GFR - Abnormal; Notable for the following components:   Glucose, Bld 197 (*)    All other components within normal limits  CBC    EKG: None  Radiology: No results found.   Procedures    Medications Ordered in the ED - No data to display                                  Medical Decision Making Amount and/or Complexity of Data Reviewed Labs: ordered. Radiology: ordered.   Patient is urinalysis consistent with infection.  No evidence of severe systemic illness.  Patient afebrile here.  Renal CT shows decompression of the patient's right kidney.  Will place on antibiotics and discharge     Final diagnoses:  None    ED Discharge Orders     None          Dasie Faden, MD 09/10/23 1125

## 2023-09-10 NOTE — ED Triage Notes (Signed)
 Pt reports left back pain starting last night. Has had nephrostomy tube x2 weeks, 'feels different than before'.

## 2023-09-16 ENCOUNTER — Encounter: Payer: Self-pay | Admitting: Urology

## 2023-09-16 DIAGNOSIS — N130 Hydronephrosis with ureteropelvic junction obstruction: Secondary | ICD-10-CM | POA: Diagnosis not present

## 2023-09-17 ENCOUNTER — Encounter (HOSPITAL_BASED_OUTPATIENT_CLINIC_OR_DEPARTMENT_OTHER): Payer: Self-pay | Admitting: Urology

## 2023-09-17 ENCOUNTER — Other Ambulatory Visit (HOSPITAL_COMMUNITY): Payer: Self-pay | Admitting: Urology

## 2023-09-17 DIAGNOSIS — N1330 Unspecified hydronephrosis: Secondary | ICD-10-CM

## 2023-09-23 ENCOUNTER — Ambulatory Visit (HOSPITAL_COMMUNITY)
Admission: RE | Admit: 2023-09-23 | Discharge: 2023-09-23 | Disposition: A | Discharge status: Home / Self Care | Source: Ambulatory Visit | Attending: Urology | Admitting: Urology

## 2023-09-23 DIAGNOSIS — N1330 Unspecified hydronephrosis: Secondary | ICD-10-CM | POA: Insufficient documentation

## 2023-09-23 MED ORDER — FUROSEMIDE 10 MG/ML IJ SOLN
INTRAMUSCULAR | Status: AC
  Filled 2023-09-23: qty 4

## 2023-09-23 MED ORDER — TECHNETIUM TC 99M MERTIATIDE
4.9000 | Freq: Once | INTRAVENOUS | Status: AC
  Administered 2023-09-23: 4.9 via INTRAVENOUS

## 2023-09-23 MED ORDER — FUROSEMIDE 10 MG/ML IJ SOLN
39.2000 mg | Freq: Once | INTRAMUSCULAR | Status: AC
  Administered 2023-09-23: 39.2 mg via INTRAVENOUS

## 2023-09-28 ENCOUNTER — Other Ambulatory Visit: Payer: Self-pay | Admitting: Urology

## 2023-09-28 DIAGNOSIS — N130 Hydronephrosis with ureteropelvic junction obstruction: Secondary | ICD-10-CM | POA: Diagnosis not present

## 2023-09-28 DIAGNOSIS — R1084 Generalized abdominal pain: Secondary | ICD-10-CM | POA: Diagnosis not present

## 2023-09-29 ENCOUNTER — Encounter (HOSPITAL_COMMUNITY): Payer: Self-pay | Admitting: Urology

## 2023-09-29 NOTE — Progress Notes (Signed)
 For Anesthesia: PCP - Dorn DELENA Sauers, MD office note 09/07/23  Cardiologist - N/A  Bowel Prep reminder: N/A  Chest x-ray - 08/26/23 in [facility] EKG - 08/26/23 in [facility] Stress Test - N/A ECHO - N/A Cardiac Cath - N/A Pacemaker/ICD device last checked: N/A Pacemaker orders received: N/A Device Rep notified: N/A  Spinal Cord Stimulator:N/A  Sleep Study - Yes CPAP - Yes  Fasting Blood Sugar - 129-144 Checks Blood Sugar __1___ times a day Date and result of last Hgb A1c-  Last dose of GLP1 agonist- N/A GLP1 instructions: Hold 7 days prior to schedule (Hold 24 hours-daily)   Last dose of SGLT-2 inhibitors- N/A SGLT-2 instructions: Hold 72 hours prior to surgery  Blood Thinner Instructions:N/A Aspirin  Instructions: Aspirin  81  Last Dose: 09/27/2023  Activity level:  Able to exercise without chest pain and/or shortness of breath     Anesthesia review: hospitalized 08/26/23 for diabetic ketoacidosis   Patient denies shortness of breath, fever, cough and chest pain at PAT appointment   Patient verbalized understanding of instructions that were reviewed over the telephone.

## 2023-09-30 ENCOUNTER — Other Ambulatory Visit: Payer: Self-pay

## 2023-09-30 ENCOUNTER — Encounter (HOSPITAL_COMMUNITY): Payer: Self-pay | Admitting: Urology

## 2023-09-30 NOTE — Progress Notes (Signed)
 Attempted to obtain medical history via telephone, unable to reach at this time. HIPAA compliant voicemail message left requesting return call to pre surgical testing department.

## 2023-10-01 NOTE — Anesthesia Preprocedure Evaluation (Addendum)
 Anesthesia Evaluation  Patient identified by MRN, date of birth, ID band Patient awake    Reviewed: Allergy & Precautions, NPO status , Patient's Chart, lab work & pertinent test results  Airway Mallampati: III  TM Distance: >3 FB Neck ROM: Full    Dental  (+) Teeth Intact, Dental Advisory Given   Pulmonary sleep apnea and Continuous Positive Airway Pressure Ventilation    Pulmonary exam normal breath sounds clear to auscultation       Cardiovascular hypertension, Pt. on medications Normal cardiovascular exam Rhythm:Regular Rate:Normal     Neuro/Psych negative neurological ROS  negative psych ROS   GI/Hepatic Neg liver ROS,GERD  Controlled and Medicated,,  Endo/Other  diabetes, Well Controlled, Type 2, Insulin  Dependent  FS 152 in preop   Renal/GU Renal disease  negative genitourinary   Musculoskeletal negative musculoskeletal ROS (+)    Abdominal   Peds  Hematology negative hematology ROS (+)   Anesthesia Other Findings   Reproductive/Obstetrics negative OB ROS                              Anesthesia Physical Anesthesia Plan  ASA: 3  Anesthesia Plan: General   Post-op Pain Management: Tylenol  PO (pre-op)*   Induction: Intravenous  PONV Risk Score and Plan: 2 and Ondansetron , Dexamethasone  and Treatment may vary due to age or medical condition  Airway Management Planned: LMA  Additional Equipment: None  Intra-op Plan:   Post-operative Plan: Extubation in OR  Informed Consent: I have reviewed the patients History and Physical, chart, labs and discussed the procedure including the risks, benefits and alternatives for the proposed anesthesia with the patient or authorized representative who has indicated his/her understanding and acceptance.     Dental advisory given  Plan Discussed with: CRNA  Anesthesia Plan Comments:          Anesthesia Quick Evaluation

## 2023-10-04 NOTE — Progress Notes (Signed)
 Anesthesia Chart Review   Case: [id number] Date/Time: 10/05/23 1045   Procedures:      CYSTOSCOPY, WITH BIOPSY (Left) - CYSTOSCOPY WITH LEFT URETEROSCOPY, STENT PLACEMENT, AND POSSIBLE BIOPSY     CYSTOURETEROSCOPY, WITH STENT INSERTION (Left)   Anesthesia type: General   Diagnosis: Acquired hydronephrosis with ureteropelvic junction (UPJ) obstruction [N13.0]   Pre-op diagnosis: LEFT URETEROPELVIC JUNCTION OBSTRUCTION   Location: [facility] ROOM 03 / [facility]   Surgeons: Devere Lonni Righter, MD       DISCUSSION:78 y.o. HTN, OSA, DM II, left ureteropelvic junction obstruction scheduled for above procedure 10/05/2023 with Dr. Lonni Devere.   Pt reports he can exercise without chest pain or shortness of breath.   Recent admission 8/7-8/10/25 for DKA, AKI. Pt presented to ED with left sided abdominal pain and flank pain for three weeks. Incidentally found to be in DKA. CT renal stone protocol showed new moderate dilation of the left renal pelvis and calyces with transition at the ureteropelvic junction and perinephric soft tissue stranding suspicious for acute UPJ obstruction. Pt underwent PCN with IR 8/8.   Seen in ED 09/10/23 for persistent left flank pain, no signs of infection, pt afebrile. Labs stable; Creatinine back to baseline, 1.07. Pt placed on antibiotics and discharged.   CBG DOS.  VS: Ht 5' 9 (1.753 m)   Wt 77.1 kg   BMI 25.10 kg/m   PROVIDERS: Charlott Dorn LABOR, MD is PCP    LABS: Labs reviewed: Acceptable for surgery. (all labs ordered are listed, but only abnormal results are displayed)  Labs Reviewed - No data to display   IMAGES:   EKG:   CV:  Past Medical History:  Diagnosis Date   Chronic renal disease, stage III (HCC)    Diabetes mellitus without complication (HCC)    DKA (diabetic ketoacidosis) (HCC) 08/26/2023   GERD (gastroesophageal reflux disease)    History of insertion of nephrostomy tube    HOH (hard of hearing)    Hyperlipidemia     Hypertension    Meningioma (HCC)    left frontal   Wears hearing aid in both ears     Past Surgical History:  Procedure Laterality Date   APPENDECTOMY     BRAIN SURGERY     COLONOSCOPY WITH PROPOFOL  N/A 05/26/2016   Procedure: COLONOSCOPY WITH PROPOFOL ;  Surgeon: Vicci Gladis POUR, MD;  Location: [facility];  Service: Endoscopy;  Laterality: N/A;   IR NEPHROSTOMY PLACEMENT LEFT  08/27/2023   TONSILLECTOMY      MEDICATIONS: No current facility-administered medications for this encounter.    amLODipine  (NORVASC ) 5 MG tablet   aspirin  EC 81 MG tablet   insulin  degludec (TRESIBA FLEXTOUCH) 100 UNIT/ML FlexTouch Pen   Misc Natural Products (TART CHERRY ADVANCED) CAPS   oxyCODONE -acetaminophen  (PERCOCET/ROXICET) 5-325 MG tablet   pantoprazole  (PROTONIX ) 40 MG tablet   polyethylene glycol (MIRALAX  / GLYCOLAX ) 17 g packet   simvastatin (ZOCOR) 80 MG tablet      [facility], PA-C [facility] Pre-Surgical Testing [phone]

## 2023-10-05 ENCOUNTER — Ambulatory Visit (HOSPITAL_COMMUNITY)

## 2023-10-05 ENCOUNTER — Ambulatory Visit (HOSPITAL_COMMUNITY): Payer: Self-pay | Admitting: Physician Assistant

## 2023-10-05 ENCOUNTER — Encounter (HOSPITAL_COMMUNITY): Payer: Self-pay | Admitting: Urology

## 2023-10-05 ENCOUNTER — Ambulatory Visit (HOSPITAL_BASED_OUTPATIENT_CLINIC_OR_DEPARTMENT_OTHER): Payer: Self-pay | Admitting: Physician Assistant

## 2023-10-05 ENCOUNTER — Encounter (HOSPITAL_COMMUNITY)
Admission: RE | Disposition: A | Discharge status: Home / Self Care | Payer: Self-pay | Source: Home / Self Care | Attending: Urology

## 2023-10-05 ENCOUNTER — Ambulatory Visit (HOSPITAL_COMMUNITY)
Admission: RE | Admit: 2023-10-05 | Discharge: 2023-10-05 | Disposition: A | Discharge status: Home / Self Care | Attending: Urology | Admitting: Urology

## 2023-10-05 DIAGNOSIS — E119 Type 2 diabetes mellitus without complications: Secondary | ICD-10-CM

## 2023-10-05 DIAGNOSIS — K219 Gastro-esophageal reflux disease without esophagitis: Secondary | ICD-10-CM | POA: Diagnosis not present

## 2023-10-05 DIAGNOSIS — N183 Chronic kidney disease, stage 3 unspecified: Secondary | ICD-10-CM | POA: Insufficient documentation

## 2023-10-05 DIAGNOSIS — Z79899 Other long term (current) drug therapy: Secondary | ICD-10-CM | POA: Insufficient documentation

## 2023-10-05 DIAGNOSIS — N135 Crossing vessel and stricture of ureter without hydronephrosis: Secondary | ICD-10-CM | POA: Diagnosis not present

## 2023-10-05 DIAGNOSIS — Z794 Long term (current) use of insulin: Secondary | ICD-10-CM

## 2023-10-05 DIAGNOSIS — N130 Hydronephrosis with ureteropelvic junction obstruction: Secondary | ICD-10-CM | POA: Diagnosis not present

## 2023-10-05 DIAGNOSIS — I10 Essential (primary) hypertension: Secondary | ICD-10-CM

## 2023-10-05 DIAGNOSIS — G4733 Obstructive sleep apnea (adult) (pediatric): Secondary | ICD-10-CM | POA: Insufficient documentation

## 2023-10-05 DIAGNOSIS — E1022 Type 1 diabetes mellitus with diabetic chronic kidney disease: Secondary | ICD-10-CM | POA: Insufficient documentation

## 2023-10-05 DIAGNOSIS — E1110 Type 2 diabetes mellitus with ketoacidosis without coma: Secondary | ICD-10-CM

## 2023-10-05 DIAGNOSIS — I129 Hypertensive chronic kidney disease with stage 1 through stage 4 chronic kidney disease, or unspecified chronic kidney disease: Secondary | ICD-10-CM | POA: Insufficient documentation

## 2023-10-05 DIAGNOSIS — Z7985 Long-term (current) use of injectable non-insulin antidiabetic drugs: Secondary | ICD-10-CM | POA: Insufficient documentation

## 2023-10-05 DIAGNOSIS — Z7982 Long term (current) use of aspirin: Secondary | ICD-10-CM | POA: Insufficient documentation

## 2023-10-05 DIAGNOSIS — N132 Hydronephrosis with renal and ureteral calculous obstruction: Secondary | ICD-10-CM | POA: Insufficient documentation

## 2023-10-05 HISTORY — DX: Unspecified hearing loss, unspecified ear: H91.90

## 2023-10-05 HISTORY — DX: Benign neoplasm of meninges, unspecified: D32.9

## 2023-10-05 HISTORY — DX: Other specified postprocedural states: Z98.890

## 2023-10-05 HISTORY — DX: Chronic kidney disease, stage 3 unspecified: N18.30

## 2023-10-05 HISTORY — DX: Gastro-esophageal reflux disease without esophagitis: K21.9

## 2023-10-05 HISTORY — PX: CYSTOSCOPY W/ URETERAL STENT PLACEMENT: SHX1429

## 2023-10-05 HISTORY — DX: Presence of external hearing-aid: Z97.4

## 2023-10-05 LAB — GLUCOSE, CAPILLARY
Glucose-Capillary: 152 mg/dL — ABNORMAL HIGH (ref 70–99)
Glucose-Capillary: 147 mg/dL — ABNORMAL HIGH (ref 70–99)
Glucose-Capillary: 155 mg/dL — ABNORMAL HIGH (ref 70–99)

## 2023-10-05 LAB — HEMOGLOBIN A1C
Hgb A1c MFr Bld: 8.1 % — ABNORMAL HIGH (ref 4.8–5.6)
Mean Plasma Glucose: 185.77 mg/dL

## 2023-10-05 SURGERY — CYSTOSCOPY, WITH RETROGRADE PYELOGRAM AND URETERAL STENT INSERTION
Anesthesia: General | Laterality: Left

## 2023-10-05 MED ORDER — DEXAMETHASONE SODIUM PHOSPHATE 10 MG/ML IJ SOLN
INTRAMUSCULAR | Status: AC
  Filled 2023-10-05: qty 1

## 2023-10-05 MED ORDER — ONDANSETRON HCL 4 MG/2ML IJ SOLN: 4.0000 mg | Freq: Once | INTRAMUSCULAR | Status: DC | PRN

## 2023-10-05 MED ORDER — LIDOCAINE HCL (PF) 2 % IJ SOLN
INTRAMUSCULAR | Status: AC
  Filled 2023-10-05: qty 5

## 2023-10-05 MED ORDER — ONDANSETRON HCL 4 MG/2ML IJ SOLN
INTRAMUSCULAR | Status: DC | PRN
  Administered 2023-10-05: 4 mg via INTRAVENOUS

## 2023-10-05 MED ORDER — LIDOCAINE HCL (PF) 2 % IJ SOLN
INTRAMUSCULAR | Status: DC | PRN
  Administered 2023-10-05: 60 mg via INTRADERMAL

## 2023-10-05 MED ORDER — ONDANSETRON HCL 4 MG/2ML IJ SOLN
INTRAMUSCULAR | Status: AC
  Filled 2023-10-05: qty 2

## 2023-10-05 MED ORDER — ACETAMINOPHEN 500 MG PO TABS
1000.0000 mg | ORAL_TABLET | Freq: Once | ORAL | Status: AC
Start: 2023-10-05 — End: 2023-10-05
  Administered 2023-10-05: 1000 mg via ORAL
  Filled 2023-10-05: qty 2

## 2023-10-05 MED ORDER — FENTANYL CITRATE (PF) 100 MCG/2ML IJ SOLN
INTRAMUSCULAR | Status: AC
  Filled 2023-10-05: qty 2

## 2023-10-05 MED ORDER — SODIUM CHLORIDE 0.9 % IR SOLN
Status: DC | PRN
  Administered 2023-10-05: 3000 mL via INTRAVESICAL

## 2023-10-05 MED ORDER — PHENYLEPHRINE 80 MCG/ML (10ML) SYRINGE FOR IV PUSH (FOR BLOOD PRESSURE SUPPORT)
PREFILLED_SYRINGE | INTRAVENOUS | Status: DC | PRN
  Administered 2023-10-05 (×2): 80 ug via INTRAVENOUS

## 2023-10-05 MED ORDER — LIDOCAINE 2% (20 MG/ML) 5 ML SYRINGE: INTRAMUSCULAR | Status: DC | PRN

## 2023-10-05 MED ORDER — DEXAMETHASONE SODIUM PHOSPHATE 10 MG/ML IJ SOLN
INTRAMUSCULAR | Status: DC | PRN
  Administered 2023-10-05: 8 mg via INTRAVENOUS

## 2023-10-05 MED ORDER — FENTANYL CITRATE (PF) 100 MCG/2ML IJ SOLN
INTRAMUSCULAR | Status: DC | PRN
  Administered 2023-10-05: 50 ug via INTRAVENOUS

## 2023-10-05 MED ORDER — ORAL CARE MOUTH RINSE: 15.0000 mL | Freq: Once | OROMUCOSAL | Status: AC

## 2023-10-05 MED ORDER — PHENAZOPYRIDINE HCL 200 MG PO TABS
200.0000 mg | ORAL_TABLET | Freq: Three times a day (TID) | ORAL | 0 refills | Status: AC | PRN
Start: 2023-10-05 — End: 2024-10-04

## 2023-10-05 MED ORDER — INSULIN ASPART 100 UNIT/ML IJ SOLN: 0.0000 [IU] | INTRAMUSCULAR | Status: DC | PRN

## 2023-10-05 MED ORDER — CHLORHEXIDINE GLUCONATE 0.12 % MT SOLN
15.0000 mL | Freq: Once | OROMUCOSAL | Status: AC
  Administered 2023-10-05: 15 mL via OROMUCOSAL

## 2023-10-05 MED ORDER — FENTANYL CITRATE PF 50 MCG/ML IJ SOSY: 25.0000 ug | PREFILLED_SYRINGE | INTRAMUSCULAR | Status: DC | PRN

## 2023-10-05 MED ORDER — LACTATED RINGERS IV SOLN
INTRAVENOUS | Status: DC
Start: 2023-10-05 — End: 2023-10-05

## 2023-10-05 MED ORDER — PROPOFOL 10 MG/ML IV BOLUS
INTRAVENOUS | Status: DC | PRN
  Administered 2023-10-05: 150 mg via INTRAVENOUS

## 2023-10-05 MED ORDER — OXYBUTYNIN CHLORIDE 5 MG PO TABS: 5.0000 mg | ORAL_TABLET | Freq: Three times a day (TID) | ORAL | 1 refills | Status: AC | PRN

## 2023-10-05 MED ORDER — IOHEXOL 300 MG/ML  SOLN
INTRAMUSCULAR | Status: DC | PRN
  Administered 2023-10-05: 18 mL

## 2023-10-05 MED ORDER — PROPOFOL 10 MG/ML IV BOLUS
INTRAVENOUS | Status: AC
  Filled 2023-10-05: qty 20

## 2023-10-05 MED ORDER — CEFAZOLIN SODIUM-DEXTROSE 2-4 GM/100ML-% IV SOLN
2.0000 g | INTRAVENOUS | Status: AC
Start: 2023-10-05 — End: 2023-10-05
  Administered 2023-10-05: 2 g via INTRAVENOUS
  Filled 2023-10-05: qty 100

## 2023-10-05 SURGICAL SUPPLY — 25 items
BAG URINE DRAIN 2000ML AR STRL (UROLOGICAL SUPPLIES) IMPLANT
BAG URO CATCHER STRL LF (MISCELLANEOUS) ×1 IMPLANT
BASKET ZERO TIP NITINOL 2.4FR (BASKET) IMPLANT
CATH URETERAL DUAL LUMEN 10F (MISCELLANEOUS) IMPLANT
CATH URETL OPEN 5X70 (CATHETERS) ×1 IMPLANT
CLOTH BEACON ORANGE TIMEOUT ST (SAFETY) ×1 IMPLANT
DRAPE FOOT SWITCH (DRAPES) ×1 IMPLANT
ELECT REM PT RETURN 15FT ADLT (MISCELLANEOUS) ×1 IMPLANT
EXTRACTOR STONE NITINOL NGAGE (UROLOGICAL SUPPLIES) IMPLANT
FIBER LASER MOSES 200 DFL (Laser) IMPLANT
GLOVE SURG LX STRL 8.0 MICRO (GLOVE) ×1 IMPLANT
GOWN STRL SURGICAL XL XLNG (GOWN DISPOSABLE) ×1 IMPLANT
GUIDEWIRE STR DUAL SENSOR (WIRE) IMPLANT
GUIDEWIRE ZIPWRE .038 STRAIGHT (WIRE) ×1 IMPLANT
KIT TURNOVER KIT A (KITS) ×1 IMPLANT
LOOP CUT BIPOLAR 24F LRG (ELECTROSURGICAL) IMPLANT
MANIFOLD NEPTUNE II (INSTRUMENTS) ×1 IMPLANT
PACK CYSTO (CUSTOM PROCEDURE TRAY) ×1 IMPLANT
SHEATH NAVIGATOR HD 11/13X28 (SHEATH) IMPLANT
SHEATH NAVIGATOR HD 11/13X36 (SHEATH) IMPLANT
STENT URET 6FRX24 CONTOUR (STENTS) IMPLANT
STENT URET 6FRX26 CONTOUR (STENTS) IMPLANT
SYRINGE TOOMEY IRRIG 70ML (MISCELLANEOUS) IMPLANT
TUBING CONNECTING 10 (TUBING) ×1 IMPLANT
TUBING UROLOGY SET (TUBING) ×1 IMPLANT

## 2023-10-05 NOTE — Transfer of Care (Signed)
 Immediate Anesthesia Transfer of Care Note  Patient: Luis Shaw  Procedure(s) Performed: CYSTOSCOPY, WITH RETROGRADE PYELOGRAM AND URETERAL STENT INSERTION (Left)  Patient Location: PACU  Anesthesia Type:General  Level of Consciousness: drowsy  Airway & Oxygen Therapy: Patient Spontanous Breathing and Patient connected to face mask oxygen  Post-op Assessment: Report given to RN and Post -op Vital signs reviewed and stable  Post vital signs: Reviewed and stable  Last Vitals:  Vitals Value Taken Time  BP 146/80 10/05/23 12:12  Temp    Pulse 63 10/05/23 12:15  Resp 21 10/05/23 12:15  SpO2 93 % 10/05/23 12:15  Vitals shown include unfiled device data.  Last Pain:  Vitals:   10/05/23 0930  TempSrc:   PainSc: 0-No pain         Complications: No notable events documented.

## 2023-10-05 NOTE — H&P (Signed)
 Office Visit Report     09/16/2023   --------------------------------------------------------------------------------   Luis HERO. Shaw  MRN: [id number]  DOB: Feb 02, 1945, 78 year old Male  SSN:    PRIMARY CARE:  Dorn Sauers, MD  PRIMARY CARE FAX:  [phone]  REFERRING:  Dorn Sauers, MD  PROVIDER:  Lonni Han, M.D.  LOCATION:  [facility] [phone]     --------------------------------------------------------------------------------   CC/HPI: Left UPJ obstruction   Mr. Luis Shaw is a 78 year old male with a left sided UPJ obstruction of unknown etiology. He is s/p left PCN on 08/26/2023 after he was found to have left-sided hydronephrosis on non-con CT while he was in the ICU being treated for DKA.   09/16/23: The patient is here today for a routine follow-up. He was seen in the ER on 09/10/23 due worsening left flank pain. CTSS at that time showed that his left PCN was in place with resolution of his hydronephrosis--no other acute findings noted. He was treated for a suspected UTI, though urine culture was obtained. He denies interval fever/chills, nausea/vomiting or hematuria. AFVSS today. He continues to have positional discomfort from his nephrostomy tube.     ALLERGIES: No Allergies    MEDICATIONS: Simvastatin 80 MG Tablet  amLODIPine  Besylate 5 MG Tablet  Aspirin  81 81 MG Tablet Delayed Release  MiraLax  Mix-In Pax 17 GM Packet  Tart Cherry Advanced  Tresiba FlexTouch 100 UNIT/ML Solution Pen-injector     GU PSH: No GU PSH    NON-GU PSH: No Non-GU PSH    GU PMH: No GU PMH      PMH Notes: Endocrine  DKA (diabetic ketoacidosis) (HCC)   Genitourinary  Obstruction of left ureteropelvic junction (UPJ)   Other  Hyperkalemia  Dehydration  Hypercalcemia  Elevated serum creatinine  Leukocytosis      NON-GU PMH: No Non-GU PMH    FAMILY HISTORY: No Family History    SOCIAL HISTORY: Marital Status: Married    REVIEW OF  SYSTEMS:    GU Review Male:   Patient denies frequent urination, hard to postpone urination, burning/ pain with urination, get up at night to urinate, leakage of urine, stream starts and stops, trouble starting your stream, have to strain to urinate , erection problems, and penile pain.  Gastrointestinal (Upper):   Patient denies nausea, vomiting, and indigestion/ heartburn.  Gastrointestinal (Lower):   Patient denies diarrhea and constipation.  Constitutional:   Patient denies fever, night sweats, weight loss, and fatigue.  Skin:   Patient denies skin rash/ lesion and itching.  Eyes:   Patient denies blurred vision and double vision.  Ears/ Nose/ Throat:   Patient denies sore throat and sinus problems.  Hematologic/Lymphatic:   Patient denies swollen glands and easy bruising.  Cardiovascular:   Patient denies leg swelling and chest pains.  Respiratory:   Patient denies cough and shortness of breath.  Endocrine:   Patient denies excessive thirst.  Musculoskeletal:   Patient denies back pain and joint pain.  Neurological:   Patient denies headaches and dizziness.  Psychologic:   Patient denies depression and anxiety.   Notes: Nephrostomy tube placed    VITAL SIGNS:      09/16/2023 02:00 PM  Weight 169 lb / 76.66 kg  Height 69 in / 175.26 cm  BP 134/72 mmHg  Temperature 97.8 F / 36.5 C  BMI 25.0 kg/m   Complexity of Data:  Lab Test Review:   BMP  Records Review:   Previous Hospital Records, Previous  Patient Records  X-Ray Review: C.T. Abdomen/Pelvis: Reviewed Films. Reviewed Report. Discussed With Patient. CLINICAL DATA: Abdominal/flank pain, stone suspected   Left lower back pain worsening since July. No known injury. History  of diabetes and appendectomy.   EXAM:  CT ABDOMEN AND PELVIS WITHOUT CONTRAST   TECHNIQUE:  Multidetector CT imaging of the abdomen and pelvis was performed  following the standard protocol without IV contrast.   RADIATION DOSE REDUCTION: This exam was  performed according to the  departmental dose-optimization program which includes automated  exposure control, adjustment of the mA and/or kV according to  patient size and/or use of iterative reconstruction technique.   COMPARISON: Abdominopelvic CT 03/25/2011. Chest CT 02/01/2020.   FINDINGS:  Lower chest: Aortic and coronary artery atherosclerosis noted. The  lung bases are clear. There is a small hiatal hernia.   Hepatobiliary: The liver has a non cirrhotic morphology. No  suspicious focal abnormalities are identified on noncontrast  imaging. No evidence of gallstones, gallbladder wall thickening or  biliary dilatation.   Pancreas: Unremarkable. No pancreatic ductal dilatation or  surrounding inflammatory changes.   Spleen: Normal in size without focal abnormality.   Adrenals/Urinary Tract: Both adrenal glands appear normal. There is  a punctate nonobstructing calculus in the interpolar region of the  right kidney. No other urinary tract calculi are demonstrated. New  moderate dilatation of the left renal pelvis and calyces with abrupt  transition at the ureteropelvic junction. Associated mild asymmetric  left perinephric soft tissue stranding, suggesting a degree of acute  collecting system obstruction. No right-sided hydronephrosis. There  are simple and mildly complex renal cysts bilaterally for which no  specific follow-up imaging is recommended. The bladder appears  unremarkable for its degree of distention.   Stomach/Bowel: No enteric contrast administered. The stomach appears  unremarkable for its degree of distension. No evidence of bowel wall  thickening, distention or surrounding inflammatory change. Mildly  prominent stool in the proximal colon.   Vascular/Lymphatic: There are no enlarged abdominal or pelvic lymph  nodes. Aortic and branch vessel atherosclerosis without evidence of  aneurysm. Retroaortic left renal vein noted.   Reproductive: Mild enlargement  of the prostate gland.   Other: Stable mildly prominent fat in both inguinal canals. Small  umbilical hernia containing only fat. No ascites or  pneumoperitoneum.   Musculoskeletal: No acute or significant osseous findings. Stable  benign appearing sclerotic lesion in the right superior pubic ramus,  likely a bone island.   IMPRESSION:  1. New moderate dilatation of the left renal pelvis and calyces with  abrupt transition at the ureteropelvic junction and perinephric soft  tissue stranding, suspicious for acute UPJ obstruction. A specific  cause is not demonstrated by this noncontrast study. Differential  considerations include recently passed calculus, blood clot and  tumor (transitional cell carcinoma). Urology follow-up recommended.  2. Punctate nonobstructing right renal calculus. No other urinary  tract calculi.  3. No other acute abdominopelvic findings.  4. Aortic Atherosclerosis (ICD10-I70.0).    Electronically Signed  By: Elsie Perone M.D.  On: 08/26/2023 11:25     Notes:                     CLINICAL DATA: Abdominal/flank pain, stone suspected   EXAM:  CT ABDOMEN AND PELVIS WITHOUT CONTRAST   TECHNIQUE:  Multidetector CT imaging of the abdomen and pelvis was performed  following the standard protocol without IV contrast.   RADIATION DOSE REDUCTION: This exam was performed according to the  departmental dose-optimization program which includes automated  exposure control, adjustment of the mA and/or kV according to  patient size and/or use of iterative reconstruction technique.   COMPARISON: 08/26/2023   FINDINGS:  Lower chest: Coronary and aortic calcifications. No pleural or  pericardial effusion. Linear scarring or atelectasis at the lung  bases as before.   Hepatobiliary: No focal liver abnormality is seen. No gallstones,  gallbladder wall thickening, or biliary dilatation.   Pancreas: Unremarkable. No pancreatic ductal dilatation or  surrounding  inflammatory changes.   Spleen: Normal in size without focal abnormality.   Adrenals/Urinary Tract: No adrenal mass. Interval placement of left  percutaneous nephrostomy catheter with resolution of hydronephrosis.  4.6 cm 12 HU cortical cyst from lower pole left kidney stable. 2 mm  calcification peripherally in the mid right renal collecting system.  Urinary bladder incompletely distended.   Stomach/Bowel: Small hiatal hernia. Stomach is incompletely  distended. Small bowel decompressed. Staple line at the base of the  cecum. The colon is partially distended, with a few diverticula at  the descending/sigmoid junction; no adjacent inflammatory change.   Vascular/Lymphatic: Scattered aortoiliac calcified atheromatous  plaque without AAA. Retroaortic left renal vein, anatomic variant.  No abdominal or pelvic adenopathy.   Reproductive: Prostate enlargement with scattered coarse  calcifications.   Other: Left pelvic phleboliths. No ascites. No free air.   Musculoskeletal: Mild degenerative disc disease L5-S1. Vertebral  endplate spurring at multiple levels in the lower thoracic spine.   IMPRESSION:  1. Interval placement of left percutaneous nephrostomy catheter with  resolution of hydronephrosis.  2. 2 mm nonobstructing right renal calculus.  3. Small hiatal hernia.  4. Aortic Atherosclerosis (ICD10-I70.0).    Electronically Signed  By: JONETTA Faes M.D.  On: 09/10/2023 10:47     PROCEDURES:          Urinalysis Dipstick Dipstick Cont'd  Color: Yellow Bilirubin: Neg mg/dL  Appearance: Clear Ketones: Neg mg/dL  Specific Gravity: 8.984 Blood: Neg ery/uL  pH: 5.5 Protein: Trace mg/dL  Glucose: Neg mg/dL Urobilinogen: 0.2 mg/dL    Nitrites: Neg    Leukocyte Esterase: Neg leu/uL    ASSESSMENT:      ICD-10 Details  1 GU:   Hydronephrosis - N13.0 Left, Chronic, Stable     PLAN:            Medications New Meds: Cephalexin  500 MG Capsule 1 capsule PO Daily   #60  0  Refill(s)  Pharmacy Name:  [facility]  Address:  [address]   [place], [place] [id number]  Phone:  [phone]  Fax:  [phone]            Orders X-Rays: Lasix  Renogram - Left nephrostomy tube needs to be clamped during the study          Schedule Return Visit/Planned Activity: Next Available Appointment - Schedule Surgery          Document Letter(s):  Created for Dorn Sauers, MD   Created for Patient: Clinical Summary         Notes:   -Lasix  renogram pending  -Plan for diagnostic ureteroscopy in the coming weeks to assess his left UPJ obstruction. Risks, benefits and alternatives discussed. He will need to have his Lasix  renogram performed prior to that operation  -Start once daily keflex  for UTI prophylaxis  -f/u with Dr. Sauers concerning persistently elevated blood sugars

## 2023-10-05 NOTE — Op Note (Signed)
 Operative Note  Preoperative diagnosis:  1.  Left UPJ obstruction  Postoperative diagnosis: 1.  Left UPJ obstruction  Procedure(s): 1.  Cystoscopy with diagnostic left ureteroscopy and ureteral stent placement 2.  Left retrograde pyelogram with intraoperative interpretation of fluoroscopic imaging  Surgeon: Lonni Han, MD  Assistants:  None  Anesthesia:  General  Complications:  None  EBL: Less than 5 mL  Specimens: 1.  Previously placed left nephrostomy tube was removed intact, inspected and discarded  Drains/Catheters: 1.  Left 6 French, 24 cm JJ stent  Intraoperative findings:   No intravesical or urethral abnormalities were seen. Left retrograde pyelogram revealed a slightly stenotic area at the left UPJ with mild dilation of the left renal pelvis with no caliectasis.  Indication:  Luis Shaw is a 78 y.o. male with a left sided UPJ obstruction requiring left nephrostomy tube placement.  He had a Lasix  renogram on 09/23/2023 that showed slight asymmetric decreased perfusion to the left kidney.  Mild delayed excretion and clearance of the right renal pharmaceutical by the left kidney.  Normal functioning right kidney.  Split renal function: 41% left, 59% right.SABRA  He is here today for diagnostic ureteroscopy to further investigate his UPJ obstruction.  He has been consented for the above procedures, voices understanding and wishes to proceed.  Description of procedure:  After informed consent was obtained, the patient was brought to the operating room and general LMA anesthesia was administered. The patient was then placed in the dorsolithotomy position and prepped and draped in the usual sterile fashion. A timeout was performed. A 21 French rigid cystoscope was then inserted into the urethral meatus and advanced into the bladder under direct vision. A complete bladder survey revealed no intravesical pathology.  A 5 French ureteral catheter was then inserted into  the left ureteral orifice and a retrograde pyelogram was obtained, with the findings listed above.  A Glidewire was then used to intubate the lumen of the ureteral catheter and was advanced up to the left renal pelvis, under fluoroscopic guidance.  The catheter was then removed, leaving the wire in place.  A flexible ureteroscope was then advanced over the Glidewire and up to the left renal pelvis, under fluoroscopic guidance.  A full inspection of the left renal pelvis revealed no mucosal abnormalities or tumor burden.  There was stenosis of the left UPJ that was easily bypassed by the flexible scope.  The flexible ureteroscope was then removed, leaving the Glidewire in place.  A 6 French, 24 cm JJ stent was then placed over the Glidewire and into good position within the left collecting system, confirming placement via fluoroscopy.  His left-sided nephrostomy tube was then removed.  Repeat fluoroscopic image was obtained to confirm good placement of his stent.  His bladder was drained.  He tolerated procedure well was transferred to the postanesthesia in stable condition.  Plan: Follow-up in 1 week for office cystoscopy and stent removal.  He will need a follow-up renal ultrasound in 6 to 8 weeks.

## 2023-10-05 NOTE — Anesthesia Procedure Notes (Signed)
 Procedure Name: LMA Insertion Date/Time: 10/05/2023 11:27 AM  Performed by: Rudolpho Fonda SAUNDERS, CRNAPre-anesthesia Checklist: Patient identified, Emergency Drugs available, Suction available, Patient being monitored and Timeout performed Patient Re-evaluated:Patient Re-evaluated prior to induction Oxygen Delivery Method: Circle system utilized Preoxygenation: Pre-oxygenation with 100% oxygen Induction Type: IV induction Ventilation: Mask ventilation without difficulty LMA: LMA with gastric port inserted LMA Size: 4.0 Number of attempts: 1 Dental Injury: Teeth and Oropharynx as per pre-operative assessment

## 2023-10-05 NOTE — Anesthesia Postprocedure Evaluation (Signed)
 Anesthesia Post Note  Patient: Luis Shaw  Procedure(s) Performed: CYSTOSCOPY, WITH RETROGRADE PYELOGRAM AND URETERAL STENT INSERTION (Left)     Patient location during evaluation: PACU Anesthesia Type: General Level of consciousness: awake and alert, oriented and patient cooperative Pain management: pain level controlled Vital Signs Assessment: post-procedure vital signs reviewed and stable Respiratory status: spontaneous breathing, nonlabored ventilation and respiratory function stable Cardiovascular status: blood pressure returned to baseline and stable Postop Assessment: no apparent nausea or vomiting Anesthetic complications: no   No notable events documented.  Last Vitals:  Vitals:   10/05/23 0905 10/05/23 1212  BP: (!) 142/75 (!) 146/80  Pulse: (!) 56 61  Resp: 18 16  Temp: 37 C 36.4 C  SpO2: 95% 99%    Last Pain:  Vitals:   10/05/23 0930  TempSrc:   PainSc: 0-No pain                 Almarie CHRISTELLA Marchi

## 2023-10-06 ENCOUNTER — Encounter (HOSPITAL_COMMUNITY): Payer: Self-pay | Admitting: Urology

## 2023-10-13 ENCOUNTER — Other Ambulatory Visit: Payer: Self-pay

## 2023-10-13 ENCOUNTER — Emergency Department (HOSPITAL_BASED_OUTPATIENT_CLINIC_OR_DEPARTMENT_OTHER)

## 2023-10-13 ENCOUNTER — Encounter (HOSPITAL_BASED_OUTPATIENT_CLINIC_OR_DEPARTMENT_OTHER): Payer: Self-pay

## 2023-10-13 ENCOUNTER — Emergency Department (HOSPITAL_BASED_OUTPATIENT_CLINIC_OR_DEPARTMENT_OTHER)
Admission: EM | Admit: 2023-10-13 | Discharge: 2023-10-13 | Disposition: A | Discharge status: Home / Self Care | Attending: Emergency Medicine | Admitting: Emergency Medicine

## 2023-10-13 DIAGNOSIS — Z79899 Other long term (current) drug therapy: Secondary | ICD-10-CM | POA: Diagnosis not present

## 2023-10-13 DIAGNOSIS — E119 Type 2 diabetes mellitus without complications: Secondary | ICD-10-CM | POA: Diagnosis not present

## 2023-10-13 DIAGNOSIS — N1330 Unspecified hydronephrosis: Secondary | ICD-10-CM | POA: Insufficient documentation

## 2023-10-13 DIAGNOSIS — Z794 Long term (current) use of insulin: Secondary | ICD-10-CM | POA: Diagnosis not present

## 2023-10-13 DIAGNOSIS — K429 Umbilical hernia without obstruction or gangrene: Secondary | ICD-10-CM | POA: Diagnosis not present

## 2023-10-13 DIAGNOSIS — Z7982 Long term (current) use of aspirin: Secondary | ICD-10-CM | POA: Diagnosis not present

## 2023-10-13 DIAGNOSIS — Z960 Presence of urogenital implants: Secondary | ICD-10-CM

## 2023-10-13 DIAGNOSIS — R109 Unspecified abdominal pain: Secondary | ICD-10-CM

## 2023-10-13 DIAGNOSIS — I10 Essential (primary) hypertension: Secondary | ICD-10-CM | POA: Insufficient documentation

## 2023-10-13 DIAGNOSIS — B179 Acute viral hepatitis, unspecified: Secondary | ICD-10-CM | POA: Diagnosis not present

## 2023-10-13 DIAGNOSIS — R1084 Generalized abdominal pain: Secondary | ICD-10-CM | POA: Diagnosis not present

## 2023-10-13 DIAGNOSIS — K5730 Diverticulosis of large intestine without perforation or abscess without bleeding: Secondary | ICD-10-CM | POA: Diagnosis not present

## 2023-10-13 LAB — URINALYSIS, ROUTINE W REFLEX MICROSCOPIC
Bilirubin Urine: NEGATIVE
Glucose, UA: 500 mg/dL — AB
Ketones, ur: NEGATIVE mg/dL
Nitrite: NEGATIVE
Protein, ur: 30 mg/dL — AB
Specific Gravity, Urine: 1.019 (ref 1.005–1.030)
WBC, UA: >50 WBC/hpf (ref 0–5)
pH: 6 (ref 5.0–8.0)

## 2023-10-13 LAB — CBC WITH DIFFERENTIAL/PLATELET
Abs Immature Granulocytes: 0.06 K/uL (ref 0.00–0.07)
Basophils Absolute: 0.1 K/uL (ref 0.0–0.1)
Basophils Relative: 0 %
Eosinophils Absolute: 0.1 K/uL (ref 0.0–0.5)
Eosinophils Relative: 0 %
HCT: 39.9 % (ref 39.0–52.0)
Hemoglobin: 13.9 g/dL (ref 13.0–17.0)
Immature Granulocytes: 0 %
Lymphocytes Relative: 6 %
Lymphs Abs: 1.1 K/uL (ref 0.7–4.0)
MCH: 32.4 pg (ref 26.0–34.0)
MCHC: 34.8 g/dL (ref 30.0–36.0)
MCV: 93 fL (ref 80.0–100.0)
Monocytes Absolute: 1.8 K/uL — ABNORMAL HIGH (ref 0.1–1.0)
Monocytes Relative: 11 %
Neutro Abs: 13.8 K/uL — ABNORMAL HIGH (ref 1.7–7.7)
Neutrophils Relative %: 83 %
Platelets: 235 K/uL (ref 150–400)
RBC: 4.29 MIL/uL (ref 4.22–5.81)
RDW: 12.2 % (ref 11.5–15.5)
WBC: 16.9 K/uL — ABNORMAL HIGH (ref 4.0–10.5)
nRBC: 0 % (ref 0.0–0.2)

## 2023-10-13 LAB — COMPREHENSIVE METABOLIC PANEL WITH GFR
ALT: 12 U/L (ref 0–44)
AST: 15 U/L (ref 15–41)
Albumin: 4 g/dL (ref 3.5–5.0)
Alkaline Phosphatase: 73 U/L (ref 38–126)
Anion gap: 15 (ref 5–15)
BUN: 19 mg/dL (ref 8–23)
CO2: 19 mmol/L — ABNORMAL LOW (ref 22–32)
Calcium: 8.9 mg/dL (ref 8.9–10.3)
Chloride: 96 mmol/L — ABNORMAL LOW (ref 98–111)
Creatinine, Ser: 1.51 mg/dL — ABNORMAL HIGH (ref 0.61–1.24)
GFR, Estimated: 47 mL/min — ABNORMAL LOW (ref >60)
Glucose, Bld: 265 mg/dL — ABNORMAL HIGH (ref 70–99)
Potassium: 3.7 mmol/L (ref 3.5–5.1)
Sodium: 129 mmol/L — ABNORMAL LOW (ref 135–145)
Total Bilirubin: 1.2 mg/dL (ref 0.0–1.2)
Total Protein: 6.6 g/dL (ref 6.5–8.1)

## 2023-10-13 LAB — BASIC METABOLIC PANEL WITH GFR
Anion gap: 13 (ref 5–15)
BUN: 19 mg/dL (ref 8–23)
CO2: 21 mmol/L — ABNORMAL LOW (ref 22–32)
Calcium: 8.5 mg/dL — ABNORMAL LOW (ref 8.9–10.3)
Chloride: 99 mmol/L (ref 98–111)
Creatinine, Ser: 1.48 mg/dL — ABNORMAL HIGH (ref 0.61–1.24)
GFR, Estimated: 48 mL/min — ABNORMAL LOW (ref >60)
Glucose, Bld: 255 mg/dL — ABNORMAL HIGH (ref 70–99)
Potassium: 4 mmol/L (ref 3.5–5.1)
Sodium: 132 mmol/L — ABNORMAL LOW (ref 135–145)

## 2023-10-13 LAB — LIPASE, BLOOD: Lipase: 21 U/L (ref 11–51)

## 2023-10-13 LAB — CBG MONITORING, ED: Glucose-Capillary: 237 mg/dL — ABNORMAL HIGH (ref 70–99)

## 2023-10-13 MED ORDER — IOHEXOL 300 MG/ML  SOLN
100.0000 mL | Freq: Once | INTRAMUSCULAR | Status: AC | PRN
  Administered 2023-10-13: 85 mL via INTRAVENOUS

## 2023-10-13 MED ORDER — ONDANSETRON HCL 4 MG/2ML IJ SOLN
4.0000 mg | Freq: Once | INTRAMUSCULAR | Status: AC
  Administered 2023-10-13: 4 mg via INTRAVENOUS
  Filled 2023-10-13: qty 2

## 2023-10-13 MED ORDER — SODIUM CHLORIDE 0.9 % IV BOLUS
1000.0000 mL | Freq: Once | INTRAVENOUS | Status: AC
  Administered 2023-10-13: 1000 mL via INTRAVENOUS

## 2023-10-13 MED ORDER — SODIUM CHLORIDE 0.9 % IV SOLN
1.0000 g | Freq: Once | INTRAVENOUS | Status: AC
  Administered 2023-10-13: 1 g via INTRAVENOUS
  Filled 2023-10-13: qty 10

## 2023-10-13 MED ORDER — MORPHINE SULFATE (PF) 4 MG/ML IV SOLN
4.0000 mg | Freq: Once | INTRAVENOUS | Status: AC
  Administered 2023-10-13: 4 mg via INTRAVENOUS
  Filled 2023-10-13: qty 1

## 2023-10-13 NOTE — Consult Note (Signed)
 Urology Consult Note   Requesting Attending Physician:  Armenta Canning, MD Service Providing Consult: Urology  Consulting Attending: Dr. Renda   Reason for Consult:  abdominal pain, left hydronephrosis, AKI  HPI: Luis Shaw is seen in consultation for reasons noted above at the request of Armenta Canning, MD. Patient is a 78 y.o. male initially presenting to [facility] emergency department with complaints of abdominal pain and bloating x 2 days.  He has been urinating frequently but denies hematuria.  Patient is known to our practice and I initially saw him earlier in August for possible left UPJ obstruction while he was in intensive care for DKA.  He has since undergone ureteroscopy with Dr. Devere which only revealed a mild stricture.  At that time ureteral stent was placed and nephrostomy tube was removed.  Patient has been doing well since and is scheduled for stent removal on Thursday of this week.  On my arrival patient is alert, oriented, and in no distress.  He is extremely hard of hearing and does not present to the hospital with his hearing aids.  I reviewed the case and plan with he and his wife.  All questions were answered to their satisfaction.  ------------------  Assessment:   78 y.o. male with AKI, left hydronephrosis, abdominal bloating, and and nausea   Recommendations: #AKI #left ureteral stent #left hydronephrosis  Recent comprehensive workup for left-sided hydronephrosis.  CT A/P notes extrarenal pelvis and some hydronephrosis, but stent is in place and findings are likely secondary to urine reflux.  His discomfort is not consistent with urinary dysfunction.  Serum creatinine of 1.51 versus baseline of 1.00.  Leukocytosis without fever.  His wife reports that his blood glucose has been uncontrolled, around 300.  With the above symptoms and no sign of systemic infection, I would be inclined to think these were sequela of his uncontrolled T2DM.  We  discussed AKI and leukocytosis in the context of hyperglycemic dehydration, as well as gastroparesis.  Family will follow-up for scheduled appointment for stent removal and seek referral to endocrinology.  No urologic surgical indication.  Please call with further questions.  Case and plan discussed with Dr. Renda and ED team  Past Medical History: Past Medical History:  Diagnosis Date   Chronic renal disease, stage III (HCC)    Diabetes mellitus without complication (HCC)    DKA (diabetic ketoacidosis) (HCC) 08/26/2023   GERD (gastroesophageal reflux disease)    History of insertion of nephrostomy tube    HOH (hard of hearing)    Hyperlipidemia    Hypertension    Meningioma (HCC)    left frontal   Wears hearing aid in both ears     Past Surgical History:  Past Surgical History:  Procedure Laterality Date   APPENDECTOMY     BRAIN SURGERY     COLONOSCOPY WITH PROPOFOL  N/A 05/26/2016   Procedure: COLONOSCOPY WITH PROPOFOL ;  Surgeon: Vicci Gladis POUR, MD;  Location: [facility];  Service: Endoscopy;  Laterality: N/A;   CYSTOSCOPY W/ URETERAL STENT PLACEMENT Left 10/05/2023   Procedure: CYSTOSCOPY, WITH RETROGRADE PYELOGRAM AND URETERAL STENT INSERTION;  Surgeon: Devere Lonni Righter, MD;  Location: [facility];  Service: Urology;  Laterality: Left;   IR NEPHROSTOMY PLACEMENT LEFT  08/27/2023   TONSILLECTOMY      Medication: Current Facility-Administered Medications  Medication Dose Route Frequency Provider Last Rate Last Admin   sodium chloride  0.9 % bolus 1,000 mL  1,000 mL Intravenous Once Hildegard Loge, PA-C  Current Outpatient Medications  Medication Sig Dispense Refill   amLODipine  (NORVASC ) 5 MG tablet Take 5 mg by mouth at bedtime.     aspirin  EC 81 MG tablet Take 81 mg by mouth daily with lunch.     cephALEXin  (KEFLEX ) 500 MG capsule Take 500 mg by mouth in the morning.     insulin  degludec (TRESIBA FLEXTOUCH) 100 UNIT/ML FlexTouch Pen Inject 35 Units into the  skin in the morning.     Misc Natural Products (TART CHERRY ADVANCED) CAPS Take 1 tablet by mouth daily.     Multiple Vitamin (MULTIVITAMIN PO) Take 1 packet by mouth in the morning and at bedtime. Peak Performance Pack: Total Health by Melaleuca     oxybutynin  (DITROPAN ) 5 MG tablet Take 1 tablet (5 mg total) by mouth every 8 (eight) hours as needed for bladder spasms. 30 tablet 1   phenazopyridine  (PYRIDIUM ) 200 MG tablet Take 1 tablet (200 mg total) by mouth 3 (three) times daily as needed (for pain with urination). 30 tablet 0   simvastatin (ZOCOR) 80 MG tablet Take 80 mg by mouth at bedtime.      Allergies: Allergies  Allergen Reactions   Ciprofloxacin Itching    Full body rash     Social History: Social History   Tobacco Use   Smoking status: Never   Smokeless tobacco: Never  Vaping Use   Vaping status: Never Used  Substance Use Topics   Alcohol use: Never   Drug use: Never    Family History History reviewed. No pertinent family history.  Review of Systems  Gastrointestinal:  Positive for abdominal pain and nausea.  Genitourinary:  Positive for frequency. Negative for dysuria, flank pain, hematuria and urgency.     Objective   Vital signs in last 24 hours: BP (!) 169/77 (BP Location: Left Arm)   Pulse 70   Temp 98.1 F (36.7 C) (Oral)   Resp 16   SpO2 95%   Physical Exam General: A&O, resting, appropriate HEENT: [place]/AT Pulmonary: Normal work of breathing Cardiovascular: no cyanosis Abdomen: Soft, NTTP, nondistended GU: clear orange urine, tinged by pyridium    Most Recent Labs: Lab Results  Component Value Date   WBC 16.9 (H) 10/13/2023   HGB 13.9 10/13/2023   HCT 39.9 10/13/2023   PLT 235 10/13/2023    Lab Results  Component Value Date   NA 129 (L) 10/13/2023   K 3.7 10/13/2023   CL 96 (L) 10/13/2023   CO2 19 (L) 10/13/2023   BUN 19 10/13/2023   CREATININE 1.51 (H) 10/13/2023   CALCIUM  8.9 10/13/2023    Lab Results  Component Value Date    INR 1.1 08/26/2023     Urine Culture: @[id number] (laburin,org,[id number],[id number])@   IMAGING: CT ABDOMEN PELVIS W CONTRAST Result Date: 10/13/2023 EXAM: CT ABDOMEN AND PELVIS WITH CONTRAST 10/13/2023 03:39:44 AM TECHNIQUE: CT of the abdomen and pelvis was performed with the administration of 85 mL of iohexol  (OMNIPAQUE ) 300 MG/ML solution. Multiplanar reformatted images are provided for review. Automated exposure control, iterative reconstruction, and/or weight-based adjustment of the mA/kV was utilized to reduce the radiation dose to as low as reasonably achievable. COMPARISON: 09/10/2023 CLINICAL HISTORY: Abdominal pain, acute, nonlocalized. History of pyelonephritis, recently had stent placed. Now with abdominal pain and bloating for 2 days. Urinating frequently. Denies painful, bloody urination. FINDINGS: LOWER CHEST: Mild scarring at the lung bases. Moderate 3-vessel coronary atherosclerosis. LIVER: The liver is unremarkable. GALLBLADDER AND BILE DUCTS: Gallbladder is unremarkable. No biliary ductal dilatation. SPLEEN: No  acute abnormality. PANCREAS: Parenchymal pancreatic calcifications, suggesting sequelae of prior / chronic pancreatitis. ADRENAL GLANDS: No acute abnormality. KIDNEYS, URETERS AND BLADDER: Left double pigtail ureteral stent in satisfactory position. Mild left hydronephrosis with prominent extrarenal pelvis, new from prior. Associated diminished / delayed enhancement of the left kidney. Left perinephric and periureteral stranding, likely related to the patient's known pyelonephritis. Multiple simple bilateral renal cysts, measuring up to 4.7 cm in the left lower kidney (image 57), benign (Bosniak 1). Per consensus, no follow-up is needed for simple Bosniak type 1 and 2 renal cysts, unless the patient has a malignancy history or risk factors. No stones in the kidneys or ureters. Urinary bladder is unremarkable. GI AND BOWEL: Stomach demonstrates no acute abnormality. Mild left colonic  diverticulosis, without evidence of diverticulitis. Prior appendectomy. There is no bowel obstruction. PERITONEUM AND RETROPERITONEUM: No ascites. No free air. Small fat-containing periumbilical hernia. VASCULATURE: Atherosclerotic calcifications of the abdominal aorta and branch vessels, although patent. LYMPH NODES: No lymphadenopathy. REPRODUCTIVE ORGANS: Prostatomegaly. BONES AND SOFT TISSUES: Degenerative changes of the lower thoracic spine. Mild fat in the bilateral inguinal canals. No acute osseous abnormality. No focal soft tissue abnormality. IMPRESSION: 1. Mild left hydronephrosis with prominent extrarenal pelvis, new from prior. 2. Left double pigtail ureteral stent in satisfactory position. 3. Findings compatible with the patient's known pyelonephritis. 4. Additional ancillary findings, as above. Electronically signed by: Pinkie Pebbles MD 10/13/2023 03:51 AM EDT RP Workstation: [id number]    ------  Ole Bourdon, NP Pager: [phone]   Please contact the urology consult pager with any further questions/concerns.

## 2023-10-13 NOTE — ED Provider Notes (Signed)
 Patient transferred from [facility] ED for urology evaluation. Physical Exam  BP (!) 156/94   Pulse 84   Temp 98.1 F (36.7 C) (Oral)   Resp 17   SpO2 94%   Procedures  Procedures  ED Course / MDM   Clinical Course as of 10/13/23 1357  Wed Oct 13, 2023  0840 Spoke with Ole NP with urology.  He will evaluate patient. [AA]  1131 Discussed with urology who saw the patient at bedside and did a cystoscope.  They do not feel this is a primary urological issue.  They are not concerned for an infection. There is some concern for dehydration given the creatinine bump.  Will provide additional fluids and will repeat BMP.  Patient is feeling improved from before. If creatinine still improving since he had a fluid bolus prior to transfer and is symptomatically improved will discharge.  He has a follow-up with urology in 2 days.  His pain that he had last night is improved. [AA]    Clinical Course User Index [AA] Hildegard Loge, PA-C   Medical Decision Making Amount and/or Complexity of Data Reviewed Labs: ordered. Radiology: ordered.  Risk Prescription drug management.   Evaluated by urology.  They did not think that this was a primary urological concern.  They did do a bedside cystoscopy.  The plan to follow-up with patient in 1 to 2 days in the clinic to have the stent removed. Repeat BMP does show improvement in creatinine although mild.  Pain is controlled and improved.  Patient and wife feel comfortable with discharge.  They will follow-up with urology at their scheduled appointment which they believed to be tomorrow at 10 AM.  Strict return precautions given.  Discharged in stable condition.       Hildegard Loge, PA-C 10/13/23 1359    Armenta Canning, MD 10/14/23 [phone]

## 2023-10-13 NOTE — ED Notes (Signed)
 Patient transferring POV. Wife transporting patient to . Consents signed. IV secured. VSS, patient has no complaints at this time.

## 2023-10-13 NOTE — ED Triage Notes (Signed)
 Hx of pyelonephritis, recently had stent placed. Now with abd pain and bloating x2 days. Urinating frequently. Denies painful, bloody urination.

## 2023-10-13 NOTE — ED Provider Notes (Signed)
 [place] EMERGENCY DEPARTMENT AT [facility] Provider Note   CSN: [id number] Arrival date & time: 10/13/23  9770     Patient presents with: No chief complaint on file.   Luis Shaw is a 78 y.o. male.   Patient is a 78 year old male with past medical history of type 2 diabetes on insulin , hypertension, and recent hospitalization for ureteral stricture requiring a nephrostomy tube followed by ureteral stent.  Patient presenting today with complaints of abdominal pain.  Symptoms started yesterday and have been gradually worsening.  He describes feeling as though his abdomen is distended.  He describes cramping throughout his abdomen.  Last bowel movement was yesterday and reported as normal.  He denies any vomiting.  No fevers or chills.  Pain worse with movement and palpation with no alleviating factors.       Prior to Admission medications   Medication Sig Start Date End Date Taking? Authorizing Provider  amLODipine  (NORVASC ) 5 MG tablet Take 5 mg by mouth at bedtime. 12/07/19   [provider]  aspirin  EC 81 MG tablet Take 81 mg by mouth daily with lunch.    [provider]  cephALEXin  (KEFLEX ) 500 MG capsule Take 500 mg by mouth in the morning.    [provider]  insulin  degludec (TRESIBA FLEXTOUCH) 100 UNIT/ML FlexTouch Pen Inject 35 Units into the skin in the morning. 08/25/23   [provider]  Misc Natural Products (TART CHERRY ADVANCED) CAPS Take 1 tablet by mouth daily. 12/08/19   [provider]  Multiple Vitamin (MULTIVITAMIN PO) Take 1 packet by mouth in the morning and at bedtime. Peak Performance Pack: Total Health by Melaleuca    [provider]  oxybutynin  (DITROPAN ) 5 MG tablet Take 1 tablet (5 mg total) by mouth every 8 (eight) hours as needed for bladder spasms. 10/05/23   Devere Lonni Righter, MD  phenazopyridine  (PYRIDIUM ) 200 MG tablet Take 1 tablet (200 mg total) by mouth 3 (three) times daily  as needed (for pain with urination). 10/05/23 10/04/24  Devere Lonni Righter, MD  simvastatin (ZOCOR) 80 MG tablet Take 80 mg by mouth at bedtime. 12/07/19   [provider]    Allergies: Ciprofloxacin    Review of Systems  All other systems reviewed and are negative.   Updated Vital Signs BP (!) 171/92 (BP Location: Left Arm)   Pulse 86   Temp 97.6 F (36.4 C) (Oral)   Resp 18   SpO2 94%   Physical Exam Vitals and nursing note reviewed.  Constitutional:      General: He is not in acute distress.    Appearance: He is well-developed. He is not diaphoretic.  HENT:     Head: Normocephalic and atraumatic.  Cardiovascular:     Rate and Rhythm: Normal rate and regular rhythm.     Heart sounds: No murmur heard.    No friction rub.  Pulmonary:     Effort: Pulmonary effort is normal. No respiratory distress.     Breath sounds: Normal breath sounds. No wheezing or rales.  Abdominal:     General: Bowel sounds are normal. There is no distension.     Palpations: Abdomen is soft.     Tenderness: There is abdominal tenderness. There is no right CVA tenderness, left CVA tenderness, guarding or rebound.     Comments: Abdomen is somewhat distended and tympanitic.  There is tenderness to palpation across the upper abdomen.  Musculoskeletal:        General:  Normal range of motion.     Cervical back: Normal range of motion and neck supple.  Skin:    General: Skin is warm and dry.  Neurological:     Mental Status: He is alert and oriented to person, place, and time.     Coordination: Coordination normal.     (all labs ordered are listed, but only abnormal results are displayed) Labs Reviewed  COMPREHENSIVE METABOLIC PANEL WITH GFR  LIPASE, BLOOD  CBC WITH DIFFERENTIAL/PLATELET  URINALYSIS, ROUTINE W REFLEX MICROSCOPIC    EKG: None  Radiology: No results found.   Procedures   Medications Ordered in the ED  sodium chloride  0.9 % bolus 1,000 mL (has no  administration in time range)  ondansetron  (ZOFRAN ) injection 4 mg (has no administration in time range)  morphine  (PF) 4 MG/ML injection 4 mg (has no administration in time range)                                    Medical Decision Making Amount and/or Complexity of Data Reviewed Labs: ordered. Radiology: ordered.  Risk Prescription drug management.   Patient is a 78 year old male presenting with generalized abdominal pain.  In August, he had a nephrostomy tube placed for a stenosed ureter.  A stent was placed and the urostomy tube removed approximately 8 days ago.  This evening, patient began with the abdominal discomfort.  He denies any fevers or chills.  He arrives here with stable vital signs and is afebrile.  Physical examination reveals tenderness across the upper abdomen, but no peritoneal signs.  Laboratory studies obtained including CBC, CMP, and lipase.  White count is 16.9, but laboratory studies otherwise unremarkable.  Urinalysis revealed with greater than 50 white cells, large leukocytes, and white cell clumping.  CT with renal protocol showing mild left hydronephrosis with prominent extrarenal pelvis, new from prior study.  Patient has received morphine  for pain and Zofran  for nausea and is feeling somewhat better.  He was also given IV Rocephin .  The situation has been discussed with Dr. Mitchell from urology who feels as though the patient should come to [facility] long ER for urologic evaluation.  I have discussed with Dr. Nettie who agrees to admit.     Final diagnoses:  None    ED Discharge Orders     None          Geroldine Berg, MD 10/13/23 [phone]

## 2023-10-13 NOTE — ED Notes (Signed)
 Patient transported to CT

## 2023-10-13 NOTE — Discharge Instructions (Signed)
 Follow-up with urology at your scheduled appointment in 1 to 2 days.  Your blood work improved while you are in the emergency department.  You were evaluated by urology and they did not see anything concerning from their standpoint that needed to be intervened or as a cause of your symptoms.  Continue taking Tylenol  for pain control.  Use oxycodone  as needed for severe breakthrough pain.  Avoid ibuprofen, Aleve, Advil as these were all NSAIDs and can further damage your kidneys.  Your kidney function was slightly elevated but this did improve with some fluids.  Continue to drink plenty of fluids.  Return for any emergent symptoms.

## 2023-10-14 DIAGNOSIS — Z960 Presence of urogenital implants: Secondary | ICD-10-CM | POA: Diagnosis not present

## 2023-10-14 DIAGNOSIS — N130 Hydronephrosis with ureteropelvic junction obstruction: Secondary | ICD-10-CM | POA: Diagnosis not present

## 2023-11-04 DIAGNOSIS — R399 Unspecified symptoms and signs involving the genitourinary system: Secondary | ICD-10-CM | POA: Diagnosis not present

## 2023-11-04 DIAGNOSIS — N3001 Acute cystitis with hematuria: Secondary | ICD-10-CM | POA: Diagnosis not present

## 2023-11-10 DIAGNOSIS — L270 Generalized skin eruption due to drugs and medicaments taken internally: Secondary | ICD-10-CM | POA: Diagnosis not present

## 2023-11-10 DIAGNOSIS — N135 Crossing vessel and stricture of ureter without hydronephrosis: Secondary | ICD-10-CM | POA: Diagnosis not present

## 2023-11-10 DIAGNOSIS — E1122 Type 2 diabetes mellitus with diabetic chronic kidney disease: Secondary | ICD-10-CM | POA: Diagnosis not present

## 2023-11-10 DIAGNOSIS — Z1211 Encounter for screening for malignant neoplasm of colon: Secondary | ICD-10-CM | POA: Diagnosis not present

## 2023-11-10 DIAGNOSIS — I10 Essential (primary) hypertension: Secondary | ICD-10-CM | POA: Diagnosis not present

## 2023-11-10 DIAGNOSIS — E1165 Type 2 diabetes mellitus with hyperglycemia: Secondary | ICD-10-CM | POA: Diagnosis not present

## 2023-11-24 DIAGNOSIS — N135 Crossing vessel and stricture of ureter without hydronephrosis: Secondary | ICD-10-CM | POA: Diagnosis not present

## 2023-11-24 DIAGNOSIS — E1122 Type 2 diabetes mellitus with diabetic chronic kidney disease: Secondary | ICD-10-CM | POA: Diagnosis not present

## 2023-11-24 DIAGNOSIS — N183 Chronic kidney disease, stage 3 unspecified: Secondary | ICD-10-CM | POA: Diagnosis not present

## 2023-11-24 DIAGNOSIS — E1165 Type 2 diabetes mellitus with hyperglycemia: Secondary | ICD-10-CM | POA: Diagnosis not present

## 2023-11-24 DIAGNOSIS — L270 Generalized skin eruption due to drugs and medicaments taken internally: Secondary | ICD-10-CM | POA: Diagnosis not present

## 2023-11-24 DIAGNOSIS — I10 Essential (primary) hypertension: Secondary | ICD-10-CM | POA: Diagnosis not present
